# Patient Record
Sex: Female | Born: 1955 | Race: White | Hispanic: No | Smoking: Former smoker
Health system: Southern US, Community
[De-identification: ages and names within clinical notes are randomized; demographics above are authoritative.]

## PROBLEM LIST (undated history)

## (undated) DIAGNOSIS — F329 Major depressive disorder, single episode, unspecified: Secondary | ICD-10-CM

## (undated) DIAGNOSIS — J449 Chronic obstructive pulmonary disease, unspecified: Secondary | ICD-10-CM

## (undated) DIAGNOSIS — Z87442 Personal history of urinary calculi: Secondary | ICD-10-CM

## (undated) DIAGNOSIS — F411 Generalized anxiety disorder: Secondary | ICD-10-CM

## (undated) DIAGNOSIS — R030 Elevated blood-pressure reading, without diagnosis of hypertension: Secondary | ICD-10-CM

## (undated) DIAGNOSIS — K219 Gastro-esophageal reflux disease without esophagitis: Secondary | ICD-10-CM

## (undated) DIAGNOSIS — R55 Syncope and collapse: Secondary | ICD-10-CM

## (undated) DIAGNOSIS — E041 Nontoxic single thyroid nodule: Secondary | ICD-10-CM

## (undated) HISTORY — DX: Generalized anxiety disorder: F41.1

## (undated) HISTORY — DX: Gastro-esophageal reflux disease without esophagitis: K21.9

## (undated) HISTORY — DX: Major depressive disorder, single episode, unspecified: F32.9

## (undated) HISTORY — DX: Chronic obstructive pulmonary disease, unspecified: J44.9

## (undated) HISTORY — DX: Syncope and collapse: R55

## (undated) HISTORY — DX: Nontoxic single thyroid nodule: E04.1

## (undated) HISTORY — DX: Elevated blood-pressure reading, without diagnosis of hypertension: R03.0

## (undated) HISTORY — DX: Personal history of urinary calculi: Z87.442

---

## 1999-02-03 ENCOUNTER — Emergency Department (HOSPITAL_COMMUNITY): Admission: EM | Admit: 1999-02-03 | Discharge: 1999-02-03 | Payer: Self-pay | Admitting: Emergency Medicine

## 1999-02-03 ENCOUNTER — Encounter: Payer: Self-pay | Admitting: Emergency Medicine

## 1999-02-11 ENCOUNTER — Other Ambulatory Visit: Admission: RE | Admit: 1999-02-11 | Discharge: 1999-02-11 | Payer: Self-pay | Admitting: Obstetrics and Gynecology

## 2000-03-24 ENCOUNTER — Other Ambulatory Visit: Admission: RE | Admit: 2000-03-24 | Discharge: 2000-03-24 | Payer: Self-pay | Admitting: Obstetrics and Gynecology

## 2001-09-04 ENCOUNTER — Inpatient Hospital Stay (HOSPITAL_COMMUNITY): Admission: EM | Admit: 2001-09-04 | Discharge: 2001-09-09 | Payer: Self-pay

## 2001-09-04 ENCOUNTER — Encounter: Payer: Self-pay | Admitting: Oral Surgery

## 2001-10-06 ENCOUNTER — Other Ambulatory Visit: Admission: RE | Admit: 2001-10-06 | Discharge: 2001-10-06 | Payer: Self-pay | Admitting: Obstetrics and Gynecology

## 2003-05-02 ENCOUNTER — Other Ambulatory Visit: Admission: RE | Admit: 2003-05-02 | Discharge: 2003-05-02 | Payer: Self-pay | Admitting: Obstetrics and Gynecology

## 2005-01-13 ENCOUNTER — Other Ambulatory Visit: Admission: RE | Admit: 2005-01-13 | Discharge: 2005-01-13 | Payer: Self-pay | Admitting: Obstetrics and Gynecology

## 2005-05-23 ENCOUNTER — Emergency Department (HOSPITAL_COMMUNITY): Admission: EM | Admit: 2005-05-23 | Discharge: 2005-05-23 | Payer: Self-pay | Admitting: Emergency Medicine

## 2008-10-28 ENCOUNTER — Encounter: Payer: Self-pay | Admitting: Internal Medicine

## 2008-11-14 ENCOUNTER — Ambulatory Visit: Payer: Self-pay | Admitting: Internal Medicine

## 2008-11-14 DIAGNOSIS — R030 Elevated blood-pressure reading, without diagnosis of hypertension: Secondary | ICD-10-CM

## 2008-11-14 HISTORY — DX: Elevated blood-pressure reading, without diagnosis of hypertension: R03.0

## 2008-11-15 DIAGNOSIS — K219 Gastro-esophageal reflux disease without esophagitis: Secondary | ICD-10-CM | POA: Insufficient documentation

## 2008-11-15 DIAGNOSIS — F411 Generalized anxiety disorder: Secondary | ICD-10-CM | POA: Insufficient documentation

## 2008-11-15 DIAGNOSIS — J4489 Other specified chronic obstructive pulmonary disease: Secondary | ICD-10-CM

## 2008-11-15 DIAGNOSIS — J449 Chronic obstructive pulmonary disease, unspecified: Secondary | ICD-10-CM | POA: Insufficient documentation

## 2008-11-15 DIAGNOSIS — Z87442 Personal history of urinary calculi: Secondary | ICD-10-CM | POA: Insufficient documentation

## 2008-11-15 HISTORY — DX: Personal history of urinary calculi: Z87.442

## 2008-11-15 HISTORY — DX: Generalized anxiety disorder: F41.1

## 2008-11-15 HISTORY — DX: Chronic obstructive pulmonary disease, unspecified: J44.9

## 2008-11-15 HISTORY — DX: Gastro-esophageal reflux disease without esophagitis: K21.9

## 2008-11-15 HISTORY — DX: Other specified chronic obstructive pulmonary disease: J44.89

## 2008-12-12 ENCOUNTER — Ambulatory Visit: Payer: Self-pay | Admitting: Internal Medicine

## 2008-12-12 DIAGNOSIS — F3289 Other specified depressive episodes: Secondary | ICD-10-CM | POA: Insufficient documentation

## 2008-12-12 DIAGNOSIS — F329 Major depressive disorder, single episode, unspecified: Secondary | ICD-10-CM

## 2008-12-12 HISTORY — DX: Other specified depressive episodes: F32.89

## 2008-12-12 HISTORY — DX: Major depressive disorder, single episode, unspecified: F32.9

## 2009-01-16 ENCOUNTER — Telehealth: Payer: Self-pay | Admitting: Internal Medicine

## 2009-03-14 ENCOUNTER — Telehealth: Payer: Self-pay | Admitting: Internal Medicine

## 2009-04-29 ENCOUNTER — Telehealth: Payer: Self-pay | Admitting: Internal Medicine

## 2009-10-05 ENCOUNTER — Inpatient Hospital Stay (HOSPITAL_COMMUNITY): Admission: EM | Admit: 2009-10-05 | Discharge: 2009-10-06 | Payer: Self-pay | Admitting: Emergency Medicine

## 2009-10-06 ENCOUNTER — Encounter (INDEPENDENT_AMBULATORY_CARE_PROVIDER_SITE_OTHER): Payer: Self-pay | Admitting: Internal Medicine

## 2009-10-06 ENCOUNTER — Ambulatory Visit: Payer: Self-pay | Admitting: Vascular Surgery

## 2009-10-07 ENCOUNTER — Encounter: Payer: Self-pay | Admitting: Internal Medicine

## 2009-10-07 ENCOUNTER — Encounter (INDEPENDENT_AMBULATORY_CARE_PROVIDER_SITE_OTHER): Payer: Self-pay | Admitting: Internal Medicine

## 2009-10-10 ENCOUNTER — Ambulatory Visit: Payer: Self-pay | Admitting: Internal Medicine

## 2009-10-10 DIAGNOSIS — R55 Syncope and collapse: Secondary | ICD-10-CM

## 2009-10-10 HISTORY — DX: Syncope and collapse: R55

## 2009-12-31 ENCOUNTER — Telehealth: Payer: Self-pay | Admitting: Internal Medicine

## 2010-01-13 ENCOUNTER — Telehealth: Payer: Self-pay | Admitting: Internal Medicine

## 2010-11-06 ENCOUNTER — Telehealth: Payer: Self-pay | Admitting: Internal Medicine

## 2010-12-27 HISTORY — PX: LAPAROSCOPIC GASTRIC SLEEVE RESECTION: SHX5895

## 2011-01-28 NOTE — Progress Notes (Signed)
Summary: REQ FOR RX  Phone Note Call from Patient Call back at [facility] Phone [phone]   Summary of Call: Patient is requesting a call back. She c/o URI symptoms and does not have insurance.  Initial call taken by: Lamar Sprinkles, CMA,  November 06, 2010 10:26 AM  Follow-up for Phone Call        attempted to call pt, phone rang then cut off.  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 Patient is requesting rx or samples of antibiotic.   C/o sinus congestion, productive cough w/green-gray mucus. She is using otc tylenol & mucinex. Please advise.  Additional Follow-up by: Lamar Sprinkles, CMA,  November 06, 2010 1:48 PM    Additional Follow-up for Phone Call Additional follow up Details #2::    Pt informed  Follow-up by: Lamar Sprinkles, CMA,  November 06, 2010 3:21 PM  New/Updated Medications: ZITHROMAX TRI-PAK 500 MG TAB (AZITHROMYCIN) Take one by mouth once daily for 3 days Prescriptions: ZITHROMAX TRI-PAK 500 MG TAB (AZITHROMYCIN) Take one by mouth once daily for 3 days  #3 x 0   Entered and Authorized by:   Etta Grandchild MD   Signed by:   Etta Grandchild MD on 11/06/2010   Method used:   Electronically to        [facility]  [phone]* (retail)       [address] [place], [place]  [address]       Ph: [phone] or [phone]       Fax: [phone]   RxID:   [id number]

## 2011-01-28 NOTE — Progress Notes (Signed)
Summary: 90 day request  Phone Note Refill Request Message from:  Fax from Pharmacy on January 13, 2010 11:02 AM  Refills Requested: Medication #1:  CYMBALTA 30 MG CPEP once daily   Dosage confirmed as above?Dosage Confirmed   Supply Requested: 3 months Pt request 90 day supply per pharmacy   Method Requested: Electronic Initial call taken by: Rock Nephew CMA,  January 13, 2010 11:02 AM    Prescriptions: CYMBALTA 30 MG CPEP (DULOXETINE HCL) once daily  #90 x 3   Entered by:   Rock Nephew CMA   Authorized by:   Etta Grandchild MD   Signed by:   Rock Nephew CMA on 01/13/2010   Method used:   Electronically to        [facility]* (retail)       [address]       [address] Shipping/mailing       [place], [place]  [address]       Ph: [phone]       Fax: [phone]   RxID:   [id number]

## 2011-01-28 NOTE — Progress Notes (Signed)
Summary: Tessalon pearls  Phone Note Call from Patient Call back at [facility] Phone [phone]   Summary of Call: Pt called c/o head cold with drainage. OTC meds are not helping and she would like Tessalon Pearls called to [facility] pharmacy. Please advise. Initial call taken by: Lucious Groves,  December 31, 2009 9:55 AM  Follow-up for Phone Call        done Follow-up by: Etta Grandchild MD,  December 31, 2009 9:58 AM  Additional Follow-up for Phone Call Additional follow up Details #1::        Pt notified. Additional Follow-up by: Lucious Groves CMA,  December 31, 2009 10:37 AM    New/Updated Medications: TESSALON PERLES 100 MG CAPS (BENZONATATE) One by mouth three times a day as needed for cough Prescriptions: TESSALON PERLES 100 MG CAPS (BENZONATATE) One by mouth three times a day as needed for cough  #35 x 1   Entered and Authorized by:   Etta Grandchild MD   Signed by:   Etta Grandchild MD on 12/31/2009   Method used:   Electronically to        [facility]* (retail)       [address]       [address] Shipping[address]mail[address]       [place], [place]  [address]       Ph: [phone]       Fax: [phone]   RxID:   [id number]

## 2011-04-01 LAB — POCT CARDIAC MARKERS
CKMB, poc: 1.4 ng/mL (ref 1.0–8.0)
Myoglobin, poc: 64.6 ng/mL (ref 12–200)
Myoglobin, poc: 76.5 ng/mL (ref 12–200)
Troponin i, poc: <0.05 ng/mL (ref 0.00–0.09)
Troponin i, poc: <0.05 ng/mL (ref 0.00–0.09)

## 2011-04-01 LAB — T4, FREE: Free T4: 1.06 ng/dL (ref 0.80–1.80)

## 2011-04-01 LAB — TROPONIN I
Troponin I: 0.01 ng/mL (ref 0.00–0.06)
Troponin I: 0.01 ng/mL (ref 0.00–0.06)

## 2011-04-01 LAB — URINALYSIS, ROUTINE W REFLEX MICROSCOPIC
Glucose, UA: NEGATIVE mg/dL
Nitrite: NEGATIVE

## 2011-04-01 LAB — CBC
HCT: 38.1 % (ref 36.0–46.0)
Hemoglobin: 13 g/dL (ref 12.0–15.0)
MCV: 86.9 fL (ref 78.0–100.0)
Platelets: 239 10*3/uL (ref 150–400)
WBC: 6.8 10*3/uL (ref 4.0–10.5)

## 2011-04-01 LAB — CK TOTAL AND CKMB (NOT AT ARMC)
CK, MB: 1.5 ng/mL (ref 0.3–4.0)
CK, MB: 1.6 ng/mL (ref 0.3–4.0)
CK, MB: 1.8 ng/mL (ref 0.3–4.0)
Relative Index: INVALID (ref 0.0–2.5)
Relative Index: INVALID (ref 0.0–2.5)
Total CK: 97 U/L (ref 7–177)
Total CK: 98 U/L (ref 7–177)

## 2011-04-01 LAB — POCT I-STAT, CHEM 8
BUN: 18 mg/dL (ref 6–23)
Calcium, Ion: 0.95 mmol/L — ABNORMAL LOW (ref 1.12–1.32)
Chloride: 109 mEq/L (ref 96–112)
Creatinine, Ser: 0.5 mg/dL (ref 0.4–1.2)
Glucose, Bld: 76 mg/dL (ref 70–99)
Hemoglobin: 15 g/dL (ref 12.0–15.0)
Potassium: 4.1 mEq/L (ref 3.5–5.1)

## 2011-04-01 LAB — COMPREHENSIVE METABOLIC PANEL
AST: 25 U/L (ref 0–37)
CO2: 27 mEq/L (ref 19–32)
Creatinine, Ser: 0.61 mg/dL (ref 0.4–1.2)
GFR calc non Af Amer: >60 mL/min (ref >60)

## 2011-04-01 LAB — MAGNESIUM: Magnesium: 1.8 mg/dL (ref 1.5–2.5)

## 2011-04-01 LAB — RAPID URINE DRUG SCREEN, HOSP PERFORMED
Benzodiazepines: POSITIVE — AB
Cocaine: NOT DETECTED
Opiates: NOT DETECTED
Tetrahydrocannabinol: POSITIVE — AB

## 2011-04-01 LAB — DIFFERENTIAL
Basophils Absolute: 0 10*3/uL (ref 0.0–0.1)
Basophils Relative: 0 % (ref 0–1)
Lymphocytes Relative: 33 % (ref 12–46)
Lymphs Abs: 2.6 10*3/uL (ref 0.7–4.0)
Neutro Abs: 4.2 10*3/uL (ref 1.7–7.7)
Neutrophils Relative %: 54 % (ref 43–77)

## 2011-05-14 NOTE — Discharge Summary (Signed)
. [facility]  Patient:    Meredith Hayes, Meredith Hayes Visit Number: [id number] MRN: [id number]          Service Type: MED Location: [phone] Attending Physician:  Duke Salvia Dictated by:   Cornell Barman, P.A. Admit Date:  09/04/2001 Disc. Date: 09/08/01   CC:         Corwin Levins, M.D. [facility]   Discharge Summary  DISCHARGE DIAGNOSES: 1. Hypoxemia. 2. Dyspnea. 3. Asthmatic bronchitis.  HISTORY OF PRESENT ILLNESS:  Meredith Hayes is a 55 year old white female who presents with a 3 to 4 day history of cough which was nonproductive.  She had associated low-grade fever with progressive dyspnea.  The patient presented to the emergency department where she was found to be hypoxemic.  After several nebulizer treatments the patient was still short of breath.  The patient was admitted for IV Solu-Medrol, hand-held nebulizer therapy, and oxygen.  PAST MEDICAL HISTORY: 1. History of scarlet fever. 2. Status post dilatation and curettage. 3. Tobacco use for about 30 years.  HOSPITAL COURSE:  PULMONARY:  Hypoxemia secondary to acute asthmatic bronchitis in a patient with an extensive history of tobacco use.  The patient was admitted and started on IV steroids, oxygen, hand-held nebulizer.  She was also treated with Zithromax.  The patients condition was slow to improve. She was very slowly tapered off of her Solu-Medrol to oral steroids.  The patient has been tapered now to room air, and is maintaining saturations around 96% even after exertion.  The patient has been discouraged with her slow progress.  We did talk to the patient about smoking cessation, and the patient was enthusiastically willing at this time to discontinue cigarettes. She has been started on Wellbutrin, and is also using a nicotine patch. Again, as noted, she is quite motivated to quit smoking.  It was felt that possibly she had some underlying allergies contributing to her  persistent asthmatic bronchitic flare.  Therefore, she was started on some Allegra. Overall, the patients condition has improved, and she is felt to be stable for discharge.  DISCHARGE LABORATORY DATA:  Hemoglobin 15.1, hematocrit 43.5.  Chest x-ray showed no active disease.  DISCHARGE MEDICATIONS: 1. Prednisone taper 20 mg two tabs x 3 days, then 1-1/2 tabs x 3 days, then 1    tab x 3 days, then 1/2 tab x 3 days. 2. Advair metered dose inhaler 250/50 one puff b.i.d. 3. Allegra 180 mg q.d. 4. Wellbutrin 150 mg b.i.d. 5. Robitussin AC 20 mL q.8h. x 4 to 5 days, then p.r.n. 6. Xanax 0.5 mg q.4-6h. p.r.n. 7. ______ as at home. 8. Ambien p.r.n.  FOLLOWUP:  Dr. Jonny Ruiz in 2 to 3 weeks. Dictated by:   Cornell Barman, P.A. Attending Physician:  Duke Salvia DD:  09/08/01 TD:  09/08/01 Job: [id number]

## 2011-05-14 NOTE — H&P (Signed)
[place]  Patient:    Meredith Hayes, Meredith Hayes Visit Number: [id number] MRN: [id number]          Service Type: EMS Location: [place] Attending Physician:  Pearletha Alfred Dictated by:   Rosalyn Gess. Norins, M.D. [facility] Admit Date:  09/04/2001                           History and Physical  CHIEF COMPLAINT:  Shortness of breath.  PRIMARY PHYSICIAN:  Corwin Levins, M.D.  GYNECOLOGIST:  Trevor Iha, M.D.  HISTORY OF PRESENT ILLNESS:  Mrs. Foskey is a 55 year old married white female with no prior history of pulmonary disease who presents with a three to four-day history of cough which was nonproductive, low grade fever and progressive shortness of breath. She got to the point where she was unable to speak clearly or whole sentences. She did have some wheezing. She felt she was not moving any air.  On presentation to the emergency department, she was found to have hypoxemia by O2 saturation.  After several nebulizer treatments she continued to have persistent shortness of breath. She was now admitted for IV steroids and hand-held nebulizer therapy and O2 support.  PAST MEDICAL HISTORY: Usual childhood disease, scarlet fever with _________ penicillin treatment.  Menarche at age 55, regular menses. She is now four years postmenopausal. She is gravida 0, para 0.  PAST SURGICAL HISTORY:  Dilatation and curettage.  CURRENT MEDICATION:  Hormone replacement therapy only.  HABITS:  Tobacco one pack per day for greater than 30-pack-years.  ETOH three to four ounces per week.  Rare social use of THC.  ALLERGIES:  PENICILLIN which causes angioedema.  FAMILY HISTORY:  Negative for any pulmonary disease.  She had a grandfather, father and nephew with ASDs but no coronary artery disease.  Negative for breast cancer, negative for ovarian cancer.  Negative for colon cancer.  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  She  has been married for 16 years.  She works as a Agricultural engineer.  She has a good relationship with her husband who is present in the ER.  HEALTH MAINTENANCE:  The patients last mammogram was one year ago.  Last Pap smear was one year ago and she is scheduled to see her gynecologist in the near future.  REVIEW OF SYSTEMS:  Negative except for present illness.  PHYSICAL EXAMINATION:  GENERAL APPEARANCE:  This is an obese white female in no acute distress with no increased work of breathing.  VITAL SIGNS:  Temperature 97.5, blood pressure 122/76, respiratory rate 20, heart rate 115, O2 sat on two liters was 93%.  HEENT:  Normocephalic and atraumatic.  TMs were normal.  Oropharynx without lesions. Conjunctiva and sclera were clear. PERRLA.  EOMI. Funduscopic exam revealed normal disc margins but no vascular abnormalities.  NECK:  Supple. There was no thyromegaly or nodes.  No adenopathy was noted in the cervical or supraclavicular regions.  CHEST:  Decreased breath sounds were noted. She had diffuse inspiratory and expiratory wheezing with prolonged expiratory phase.  She had no increased work of breathing. She was not using accessory muscles for respiration.  CARDIOVASCULAR:  There were 2+ radial pulse.  She had a regular tachycardia. She had no murmurs, rubs, or gallops.  The patient had no JVD.  She had no carotid bruits.  BREASTS:  Exam deferred to gynecology.  ABDOMEN:  Obese with  a large panniculus. Palpation was hindered by her size. She was nontender with no masses.  PELVIC AND RECTAL:  Exams deferred to gynecology.  EXTREMITIES:  The patient has a great toenail job but no toenail deformities, no toe deformities.  She had no clubbing or cyanosis.  Upper extremities were normal.  NEUROLOGICAL:  Grossly nonfocal.  DATABASE:  Hemoglobin 15.1, hematocrit 43.5, white count 7900 with 66% segs, 17% lymphs, 13% monos, 4% eosinophils; platelet count 217,000.  Chemistry showed  sodium 140, potassium 3.9, chloride 105, BUN 14, creatinine 0.6, glucose 112.  Chest x-ray is not available at this time.  ASSESSMENT AND PLAN: 1. Pulmonary:  A patient with asthmatic bronchitis in a smoker.  There is    no evidence of bacterial infection; however, I cannot rule out viral or    atypical infection.  PLAN:  The patient is admitted to a regular bed.  She    will be started on IV Solu-Medrol at 125 mg first dose and 80 mg q.12h.    and will taper as quickly as possible. The patient will be started on    azithromycin at 500 mg first dose and 250 mg daily.  Will continue O2    support. The patient will be provided with Xanax to take on a p.r.n. basis    for anxiety. 2. Tobacco abuse:  Discussed this with the patient and she is interested    at this point in smoking cessation therapy.  I will not use nicotine patch    at this time.  Will use Xanax as noted above. The patient will need    referral to a smoking cessation program at the time of discharge. Dictated by:   Rosalyn Gess. Norins, M.D. [facility] Attending Physician:  Pearletha Alfred DD:  09/04/01 TD:  09/04/01 Job: [id number]

## 2011-05-27 ENCOUNTER — Encounter: Payer: Self-pay | Admitting: Internal Medicine

## 2011-05-27 ENCOUNTER — Ambulatory Visit (INDEPENDENT_AMBULATORY_CARE_PROVIDER_SITE_OTHER): Payer: PRIVATE HEALTH INSURANCE | Admitting: Internal Medicine

## 2011-05-27 DIAGNOSIS — L02818 Cutaneous abscess of other sites: Secondary | ICD-10-CM | POA: Insufficient documentation

## 2011-05-27 DIAGNOSIS — F329 Major depressive disorder, single episode, unspecified: Secondary | ICD-10-CM

## 2011-05-27 DIAGNOSIS — F411 Generalized anxiety disorder: Secondary | ICD-10-CM

## 2011-05-27 DIAGNOSIS — F3289 Other specified depressive episodes: Secondary | ICD-10-CM

## 2011-05-27 DIAGNOSIS — Z0000 Encounter for general adult medical examination without abnormal findings: Secondary | ICD-10-CM | POA: Insufficient documentation

## 2011-05-27 DIAGNOSIS — L03818 Cellulitis of other sites: Secondary | ICD-10-CM | POA: Insufficient documentation

## 2011-05-27 MED ORDER — DOXYCYCLINE HYCLATE 100 MG PO TABS: 100.0000 mg | ORAL_TABLET | Freq: Two times a day (BID) | ORAL | Status: AC

## 2011-05-27 MED ORDER — ALPRAZOLAM 0.5 MG PO TABS: 0.5000 mg | ORAL_TABLET | Freq: Every day | ORAL | Status: DC | PRN

## 2011-05-27 NOTE — Progress Notes (Signed)
  Subjective:    Patient ID: Meredith Hayes, female    DOB: 1956/05/03, 55 y.o.   MRN: [id number]  HPI Here for acute;  C/o RUQ area tender, red, swelling area with small bumps for 2 wks, without red streaks, fever, chills or drainage.  Denies worsening depressive symptoms, suicidal ideation, or panic, though has ongoing anxiety, not increased recently.  Unfortunately gained wt on cymbalta and blames the med, caused apathy so stopped and does not want to try SSRI or similar med. Main trouble seems to be getting sleep at night when her brain wont switch off, and the xanax has been helping.  Pt denies chest pain, increased sob or doe, wheezing, orthopnea, PND, increased LE swelling, palpitations, dizziness or syncope.  Pt denies new neurological symptoms such as new headache, or facial or extremity weakness or numbness   Pt denies polydipsia, polyuria.  Pt states overall good compliance with meds, little exercise however.    Past Medical History  Diagnosis Date  . ANXIETY 11/15/2008  . COPD 11/15/2008  . DEPRESSIVE DISORDER 12/12/2008  . ELEVATED BLOOD PRESSURE WITHOUT DIAGNOSIS OF HYPERTENSION 11/14/2008  . GERD 11/15/2008  . 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 SYNCOPE 10/10/2009   No past surgical history on file.  reports that she has quit smoking. She does not have any smokeless tobacco history on file. She reports that she does not drink alcohol or use illicit drugs. family history includes Arthritis in her other and Hypertension in her other. Allergies  Allergen Reactions  . Penicillins    No current outpatient prescriptions on file prior to visit.   Review of Systems All otherwise neg per pt     Objective:   Physical Exam BP 100/70  Pulse 90  Temp(Src) 97.9 F (36.6 C) (Oral)  Ht 5\' 5"  (1.651 m)  Wt 264 lb (119.75 kg)  BMI 43.93 kg/m2  SpO2 97% Physical Exam  VS noted Constitutional: Pt appears well-developed and well-nourished.  HENT: Head: Normocephalic.  Right Ear:  External ear normal.  Left Ear: External ear normal.  Eyes: Conjunctivae and EOM are normal. Pupils are equal, round, and reactive to light.  Neck: Normal range of motion. Neck supple.  Cardiovascular: Normal rate and regular rhythm.   Pulmonary/Chest: Effort normal and breath sounds normal.  Abd:  Soft, NT, non-distended, + BS Neurological: Pt is alert. No cranial nerve deficit.  Skin: Skin is warm. No erythema except for RUQ abd 2 cm area red, tender, mild swelling, no fluctuance/drainage/red streaks  Psychiatric: Pt behavior is normal. Thought content normal.  1+ nervous, not depressed affect       Assessment & Plan:

## 2011-05-27 NOTE — Assessment & Plan Note (Signed)
Declines SSRI and has stopped her cymbalta due to wt gain and apathy; to f/u further with PCP as needed

## 2011-05-27 NOTE — Assessment & Plan Note (Signed)
stable overall by hx and exam, most recent data reviewed with pt, and pt to continue medical treatment as before - gave refill xanax per pt request , does not appear to abuse or divert

## 2011-05-27 NOTE — Assessment & Plan Note (Signed)
Mild ruq area, cant r/o shingles but hx not consistent, and high risk MRSA due to emplyment;  Will tx with doxy course,  to f/u any worsening symptoms or concerns

## 2011-05-27 NOTE — Patient Instructions (Signed)
Take all new medications as prescribed Continue all other medications as before  

## 2011-08-03 ENCOUNTER — Other Ambulatory Visit: Payer: Self-pay

## 2011-08-03 DIAGNOSIS — F411 Generalized anxiety disorder: Secondary | ICD-10-CM

## 2011-08-03 NOTE — Telephone Encounter (Signed)
A user error has taken place: encounter opened in error, closed for administrative reasons.

## 2011-08-03 NOTE — Telephone Encounter (Signed)
To dr Yetta Barre - PCP

## 2011-08-06 ENCOUNTER — Telehealth: Payer: Self-pay

## 2011-08-06 DIAGNOSIS — F411 Generalized anxiety disorder: Secondary | ICD-10-CM

## 2011-08-06 MED ORDER — ALPRAZOLAM 0.5 MG PO TABS: 0.5000 mg | ORAL_TABLET | Freq: Every day | ORAL | Status: DC | PRN

## 2011-08-06 NOTE — Telephone Encounter (Signed)
Please advise if ok to refill xanax. Thanks

## 2011-08-06 NOTE — Telephone Encounter (Signed)
Patient is requesting a refill on Alprazolam 

## 2011-08-06 NOTE — Telephone Encounter (Signed)
RX called in and patient notified

## 2011-08-06 NOTE — Telephone Encounter (Signed)
yes

## 2011-08-06 NOTE — Telephone Encounter (Signed)
Pt is requesting a return call regarding Xanax refill.

## 2011-10-01 ENCOUNTER — Other Ambulatory Visit (INDEPENDENT_AMBULATORY_CARE_PROVIDER_SITE_OTHER): Payer: PRIVATE HEALTH INSURANCE

## 2011-10-01 ENCOUNTER — Ambulatory Visit (INDEPENDENT_AMBULATORY_CARE_PROVIDER_SITE_OTHER): Payer: PRIVATE HEALTH INSURANCE | Admitting: Internal Medicine

## 2011-10-01 ENCOUNTER — Encounter: Payer: Self-pay | Admitting: Internal Medicine

## 2011-10-01 ENCOUNTER — Other Ambulatory Visit: Payer: Self-pay | Admitting: Internal Medicine

## 2011-10-01 VITALS — BP 120/76 | HR 84 | Temp 98.7°F | Resp 16 | Wt 267.0 lb

## 2011-10-01 DIAGNOSIS — J449 Chronic obstructive pulmonary disease, unspecified: Secondary | ICD-10-CM

## 2011-10-01 DIAGNOSIS — D229 Melanocytic nevi, unspecified: Secondary | ICD-10-CM

## 2011-10-01 DIAGNOSIS — Z0000 Encounter for general adult medical examination without abnormal findings: Secondary | ICD-10-CM

## 2011-10-01 DIAGNOSIS — Z1231 Encounter for screening mammogram for malignant neoplasm of breast: Secondary | ICD-10-CM | POA: Insufficient documentation

## 2011-10-01 DIAGNOSIS — Z8051 Family history of malignant neoplasm of kidney: Secondary | ICD-10-CM

## 2011-10-01 DIAGNOSIS — Z87442 Personal history of urinary calculi: Secondary | ICD-10-CM

## 2011-10-01 LAB — LIPID PANEL
Cholesterol: 207 mg/dL — ABNORMAL HIGH (ref 0–200)
HDL: 47.8 mg/dL (ref >39.00)
Total CHOL/HDL Ratio: 4
Triglycerides: 74 mg/dL (ref 0.0–149.0)
VLDL: 14.8 mg/dL (ref 0.0–40.0)

## 2011-10-01 LAB — LDL CHOLESTEROL, DIRECT: Direct LDL: 145.9 mg/dL

## 2011-10-01 LAB — COMPREHENSIVE METABOLIC PANEL
ALT: 26 U/L (ref 0–35)
AST: 22 U/L (ref 0–37)
CO2: 30 mEq/L (ref 19–32)
Calcium: 9 mg/dL (ref 8.4–10.5)
Chloride: 103 mEq/L (ref 96–112)
GFR: 102.49 mL/min (ref >60.00)
Sodium: 140 mEq/L (ref 135–145)
Total Bilirubin: 0.6 mg/dL (ref 0.3–1.2)
Total Protein: 7.8 g/dL (ref 6.0–8.3)

## 2011-10-01 LAB — CBC WITH DIFFERENTIAL/PLATELET
Basophils Absolute: 0.1 10*3/uL (ref 0.0–0.1)
Eosinophils Absolute: 0.2 10*3/uL (ref 0.0–0.7)
Lymphocytes Relative: 30.9 % (ref 12.0–46.0)
Monocytes Relative: 8.6 % (ref 3.0–12.0)
Platelets: 259 10*3/uL (ref 150.0–400.0)
RDW: 13.7 % (ref 11.5–14.6)

## 2011-10-01 LAB — URINALYSIS, ROUTINE W REFLEX MICROSCOPIC
Ketones, ur: NEGATIVE
Specific Gravity, Urine: 1.025 (ref 1.000–1.030)
Total Protein, Urine: NEGATIVE
Urine Glucose: NEGATIVE
Urobilinogen, UA: 0.2 (ref 0.0–1.0)

## 2011-10-01 NOTE — Assessment & Plan Note (Signed)
Exam done, labs ordered, GYN will do her PAP next week, she needs a screening colonoscopy, labs ordered, mammogram ordered

## 2011-10-01 NOTE — Assessment & Plan Note (Signed)
Derm referral.

## 2011-10-01 NOTE — Patient Instructions (Signed)

## 2011-10-01 NOTE — Assessment & Plan Note (Signed)
mammo ordered.

## 2011-10-01 NOTE — Progress Notes (Signed)
  Subjective:    Patient ID: Meredith Hayes, female    DOB: February 22, 1956, 55 y.o.   MRN: [id number]  HPI She returns for a physical and offers no complaints. She sees her GYN next week for a PAP.   Review of Systems  Constitutional: Positive for unexpected weight change (weight gain). Negative for fever, chills, diaphoresis, activity change, appetite change and fatigue.  HENT: Negative.   Eyes: Negative.   Respiratory: Negative.   Cardiovascular: Negative.   Gastrointestinal: Negative.   Genitourinary: Negative.   Musculoskeletal: Negative.   Skin: Negative.   Neurological: Negative.   Hematological: Negative.   Psychiatric/Behavioral: Negative.        Objective:   Physical Exam  Vitals reviewed. Constitutional: She is oriented to person, place, and time. She appears well-developed and well-nourished. No distress.  HENT:  Mouth/Throat: Oropharynx is clear and moist. No oropharyngeal exudate.  Eyes: Conjunctivae are normal. Right eye exhibits no discharge. Left eye exhibits no discharge. No scleral icterus.  Neck: Normal range of motion. Neck supple. No JVD present. No tracheal deviation present. No thyromegaly present.  Cardiovascular: Normal rate, regular rhythm, normal heart sounds and intact distal pulses.  Exam reveals no gallop and no friction rub.   No murmur heard. Pulmonary/Chest: Effort normal and breath sounds normal. No stridor. No respiratory distress. She has no wheezes. She has no rales. She exhibits no tenderness.  Abdominal: Soft. Bowel sounds are normal. She exhibits no distension. There is no tenderness. There is no rebound and no guarding.  Musculoskeletal: Normal range of motion. She exhibits no edema and no tenderness.  Lymphadenopathy:    She has no cervical adenopathy.  Neurological: She is oriented to person, place, and time. She displays normal reflexes. She exhibits normal muscle tone. Coordination normal.  Skin: Skin is warm and dry. No rash noted. She  is not diaphoretic. No erythema. No pallor.       She has three fleshy nevi on her right flank and she wants to have them removed  Psychiatric: She has a normal mood and affect. Her behavior is normal. Judgment and thought content normal.          Assessment & Plan:

## 2011-10-01 NOTE — Assessment & Plan Note (Signed)
Will check a UA and renal ultrasound

## 2011-10-03 ENCOUNTER — Encounter: Payer: Self-pay | Admitting: Internal Medicine

## 2011-10-03 NOTE — Assessment & Plan Note (Signed)
stable °

## 2011-10-05 ENCOUNTER — Ambulatory Visit
Admission: RE | Admit: 2011-10-05 | Discharge: 2011-10-05 | Disposition: A | Discharge status: Home / Self Care | Payer: PRIVATE HEALTH INSURANCE | Source: Ambulatory Visit | Attending: Internal Medicine | Admitting: Internal Medicine

## 2011-10-05 DIAGNOSIS — Z8051 Family history of malignant neoplasm of kidney: Secondary | ICD-10-CM

## 2011-10-06 ENCOUNTER — Encounter: Payer: Self-pay | Admitting: Internal Medicine

## 2011-10-15 ENCOUNTER — Telehealth: Payer: Self-pay | Admitting: Internal Medicine

## 2011-10-15 NOTE — Telephone Encounter (Signed)
Pt stated she is having an allergic reaction to flu shot. Pt was given flu shot at work. Pt stated she called PCP at [facility] and was told no available appts and to call [place] office. I spoke to patient and ask her did she talk to triage nurse she said she left a message. I told pt we do not see other patient from other location however I was in process of transferring the call to my site supervisor Cinda Quest. Mrs Vonita Moss office and spoke with Amy who was going to contact patient. Patient stated she will just go to urgent care.

## 2011-10-15 NOTE — Telephone Encounter (Signed)
Per Amy at [facility] will contact the patient to assist her.

## 2011-10-18 ENCOUNTER — Ambulatory Visit (INDEPENDENT_AMBULATORY_CARE_PROVIDER_SITE_OTHER): Payer: PRIVATE HEALTH INSURANCE | Admitting: Internal Medicine

## 2011-10-18 ENCOUNTER — Encounter: Payer: Self-pay | Admitting: Internal Medicine

## 2011-10-18 VITALS — BP 110/68 | HR 80 | Temp 97.1°F | Resp 16

## 2011-10-18 DIAGNOSIS — T7840XA Allergy, unspecified, initial encounter: Secondary | ICD-10-CM

## 2011-10-18 DIAGNOSIS — Z888 Allergy status to other drugs, medicaments and biological substances status: Secondary | ICD-10-CM

## 2011-10-18 NOTE — Assessment & Plan Note (Signed)
She had an allergic reaction to the flu vaccine but that has resolved, no treatment needed at this time

## 2011-10-18 NOTE — Progress Notes (Signed)
  Subjective:    Patient ID: Meredith Hayes, female    DOB: 1956-01-08, 55 y.o.   MRN: [id number]  Allergic Reaction This is a new problem. The current episode started 5 to 7 days ago. The problem occurs constantly. The problem has been gradually improving since onset. The problem is mild. Associated with: the flu vaccine in her right shoulder. The time of exposure was just prior to onset. Pertinent negatives include no abdominal pain, chest pain, chest pressure, coughing, diarrhea, difficulty breathing, drooling, eye itching, eye redness, eye watering, globus sensation, hyperventilation, itching, rash, stridor, trouble swallowing, vomiting or wheezing. (Redness and swelling over the right the shoulder, see the 2 page documentary with pictures that she produced today) Swelling location: localized to the inj sight. Past treatments include nothing. The treatment provided significant relief.      Review of Systems  Constitutional: Negative.   HENT: Negative.  Negative for drooling and trouble swallowing.   Eyes: Negative.  Negative for redness and itching.  Respiratory: Negative.  Negative for cough, wheezing and stridor.   Cardiovascular: Negative.  Negative for chest pain.  Gastrointestinal: Negative.  Negative for vomiting, abdominal pain and diarrhea.  Genitourinary: Negative.   Musculoskeletal: Negative.   Skin: Negative.  Negative for itching and rash.  Neurological: Negative.   Hematological: Negative.   Psychiatric/Behavioral: Negative.        Objective:   Physical Exam  Vitals reviewed. Constitutional: She appears well-developed and well-nourished. No distress.  HENT:  Head: Normocephalic and atraumatic.  Mouth/Throat: No oropharyngeal exudate.  Eyes: Conjunctivae are normal. Right eye exhibits no discharge. Left eye exhibits no discharge. No scleral icterus.  Neck: Normal range of motion. Neck supple. No JVD present. No tracheal deviation present. No thyromegaly present.    Cardiovascular: Normal rate, regular rhythm, normal heart sounds and intact distal pulses.  Exam reveals no gallop and no friction rub.   No murmur heard. Pulmonary/Chest: Effort normal and breath sounds normal. No stridor. No respiratory distress. She has no wheezes. She has no rales. She exhibits no tenderness.  Abdominal: Soft. Bowel sounds are normal. She exhibits no distension and no mass. There is no tenderness. There is no rebound and no guarding.  Musculoskeletal: Normal range of motion. She exhibits no edema and no tenderness.       Right shoulder: She exhibits normal range of motion, no tenderness, no bony tenderness, no swelling, no effusion, no crepitus, no deformity, no laceration, no pain, no spasm, normal pulse and normal strength.       The right shoulder is normal now  Lymphadenopathy:    She has no cervical adenopathy.  Skin: Skin is warm and dry. No rash noted. She is not diaphoretic. No erythema. No pallor.  Psychiatric: She has a normal mood and affect. Her behavior is normal. Judgment and thought content normal.          Assessment & Plan:

## 2011-11-10 LAB — HM DEXA SCAN: HM Dexa Scan: NORMAL

## 2011-11-26 ENCOUNTER — Encounter: Payer: Self-pay | Admitting: Internal Medicine

## 2011-12-02 ENCOUNTER — Encounter: Payer: Self-pay | Admitting: Internal Medicine

## 2011-12-02 ENCOUNTER — Ambulatory Visit (AMBULATORY_SURGERY_CENTER): Payer: PRIVATE HEALTH INSURANCE

## 2011-12-02 VITALS — Ht 65.0 in | Wt 260.0 lb

## 2011-12-02 DIAGNOSIS — Z1211 Encounter for screening for malignant neoplasm of colon: Secondary | ICD-10-CM

## 2011-12-02 MED ORDER — PEG-KCL-NACL-NASULF-NA ASC-C 100 G PO SOLR: 1.0000 | Freq: Once | ORAL | Status: DC

## 2011-12-09 ENCOUNTER — Encounter: Payer: Self-pay | Admitting: Internal Medicine

## 2011-12-09 ENCOUNTER — Ambulatory Visit (AMBULATORY_SURGERY_CENTER): Payer: PRIVATE HEALTH INSURANCE | Admitting: Internal Medicine

## 2011-12-09 VITALS — BP 117/74 | HR 83 | Temp 98.2°F | Resp 17 | Ht 65.0 in | Wt 260.0 lb

## 2011-12-09 DIAGNOSIS — D126 Benign neoplasm of colon, unspecified: Secondary | ICD-10-CM

## 2011-12-09 DIAGNOSIS — D129 Benign neoplasm of anus and anal canal: Secondary | ICD-10-CM

## 2011-12-09 DIAGNOSIS — Z1211 Encounter for screening for malignant neoplasm of colon: Secondary | ICD-10-CM

## 2011-12-09 DIAGNOSIS — D128 Benign neoplasm of rectum: Secondary | ICD-10-CM

## 2011-12-09 DIAGNOSIS — K5730 Diverticulosis of large intestine without perforation or abscess without bleeding: Secondary | ICD-10-CM

## 2011-12-09 HISTORY — PX: COLONOSCOPY: SHX174

## 2011-12-09 MED ORDER — ASPIRIN 81 MG PO TABS: 81.0000 mg | ORAL_TABLET | Freq: Every day | ORAL | Status: DC

## 2011-12-09 MED ORDER — SODIUM CHLORIDE 0.9 % IV SOLN: 500.0000 mL | INTRAVENOUS | Status: DC

## 2011-12-09 NOTE — Progress Notes (Signed)
Patient did not experience any of the following events: a burn prior to discharge; a fall within the facility; wrong site/side/patient/procedure/implant event; or a hospital transfer or hospital admission upon discharge from the facility. (G8907) Patient did not have preoperative order for IV antibiotic SSI prophylaxis. (G8918)  

## 2011-12-09 NOTE — Op Note (Signed)
[facility] [address] [place], [place]  [address]  COLONOSCOPY PROCEDURE REPORT  PATIENT:  Meredith Hayes, Meredith Hayes  MR#:  [id number] BIRTHDATE:  May 09, 1956, 54 yrs. old  GENDER:  female ENDOSCOPIST:  Iva Boop, MD, [facility] REF. BY:  Etta Grandchild, M.D. PROCEDURE DATE:  12/09/2011 PROCEDURE:  Colonoscopy with snare polypectomy ASA CLASS:  Class II INDICATIONS:  Routine Risk Screening MEDICATIONS:   These medications were titrated to patient response per physician's verbal order, Fentanyl 100 mcg IV, Versed 12 mg IV, Benadryl 12.5 mg IV  DESCRIPTION OF PROCEDURE:   After the risks benefits and alternatives of the procedure were thoroughly explained, informed consent was obtained.  Digital rectal exam was performed and revealed no abnormalities.   The LB [id number] endoscope was introduced through the anus and advanced to the cecum, which was identified by both the appendix and ileocecal valve, without limitations.  The quality of the prep was excellent, using MoviPrep.  The instrument was then slowly withdrawn as the colon was fully examined. <<PROCEDUREIMAGES>>  FINDINGS:  Two polyps were found in the rectum and sigmoid colon. 8-10 mm sigmoid polyp snared with cautery and 5 mm rectal polyp cold-snared.  Scattered diverticula were found throughout the colon.  This was otherwise a normal examination of the colon. Retroflexed views in the rectum revealed no abnormalities.    The time to cecum = 4:31  minutes. The scope was then withdrawn in 15:10 minutes from the cecum and the procedure completed. COMPLICATIONS:  None ENDOSCOPIC IMPRESSION: 1) Two polyps in the rectum and sigmoid colon - removed 2) Diverticula, scattered throughout the colon 3) Otherwise normal examination RECOMMENDATIONS: 1) Hold aspirin, aspirin products, and anti-inflammatory medication for 2 weeks. REPEAT EXAM:  In for Colonoscopy, pending biopsy results.  Iva Boop, MD, [facility]  CC:   Etta Grandchild, MD and The Patient  n. eSIGNED:   Iva Boop at 12/09/2011 10:01 AM  Lorenda Cahill, [phone]

## 2011-12-09 NOTE — Patient Instructions (Addendum)
Two polyps were removed - largest 8-10 mm. You have mild diverticulosis also. Please read the information provided, including the GERd handout. I recommend you discuss your reflux issues with Dr. Yetta Barre or you could schedule an appointment with me if you like. Iva Boop, MD, [facility]  Handouts on gerd, polyps, diverticulosis, high fiber diet  Discharge instructions given per blue and green sheets  We will mail you a letter in 1-2 weeks with the results of the pathology and Dr Marvell Fuller recommendations.  Please hold aspirin, aspirin products, and all anti inflammatory medicines for 2 weeks. Use tylenol only. May resume 12-23-11.

## 2011-12-10 ENCOUNTER — Telehealth: Payer: Self-pay | Admitting: *Deleted

## 2011-12-10 NOTE — Telephone Encounter (Signed)

## 2011-12-14 NOTE — Progress Notes (Signed)
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

## 2012-02-08 ENCOUNTER — Telehealth: Payer: Self-pay

## 2012-02-08 DIAGNOSIS — F411 Generalized anxiety disorder: Secondary | ICD-10-CM

## 2012-02-08 NOTE — Telephone Encounter (Signed)
Received faxed refill request for alprazolam 0.5mg  qd, last approved 08/06/11 #[id number]. Please advise if ok to refill, pt last seen 10/18/11 Thanks

## 2012-02-08 NOTE — Telephone Encounter (Signed)
ok 

## 2012-02-09 MED ORDER — ALPRAZOLAM 0.5 MG PO TABS: 0.5000 mg | ORAL_TABLET | Freq: Every day | ORAL | Status: DC | PRN

## 2012-08-14 ENCOUNTER — Telehealth: Payer: Self-pay

## 2012-08-14 NOTE — Telephone Encounter (Signed)
Received faxed refill request for alprazolam 0.5mg  qd, last approved 02/08/12 #[id number]. Please advise if ok to refill, pt last seen 10/18/11 Thanks

## 2012-08-14 NOTE — Telephone Encounter (Signed)
She needs to be seen.

## 2012-08-14 NOTE — Telephone Encounter (Signed)
Pharmacy notified per MD

## 2012-08-18 ENCOUNTER — Ambulatory Visit: Payer: PRIVATE HEALTH INSURANCE | Admitting: Internal Medicine

## 2012-10-02 ENCOUNTER — Encounter: Payer: PRIVATE HEALTH INSURANCE | Admitting: Internal Medicine

## 2012-10-02 ENCOUNTER — Other Ambulatory Visit: Payer: BC Managed Care – PPO

## 2012-10-02 ENCOUNTER — Other Ambulatory Visit (INDEPENDENT_AMBULATORY_CARE_PROVIDER_SITE_OTHER): Payer: BC Managed Care – PPO

## 2012-10-02 ENCOUNTER — Ambulatory Visit (INDEPENDENT_AMBULATORY_CARE_PROVIDER_SITE_OTHER): Payer: BC Managed Care – PPO | Admitting: Internal Medicine

## 2012-10-02 ENCOUNTER — Encounter: Payer: Self-pay | Admitting: Internal Medicine

## 2012-10-02 VITALS — BP 118/68 | HR 104 | Temp 97.1°F | Resp 16

## 2012-10-02 DIAGNOSIS — Z1231 Encounter for screening mammogram for malignant neoplasm of breast: Secondary | ICD-10-CM

## 2012-10-02 DIAGNOSIS — Z0000 Encounter for general adult medical examination without abnormal findings: Secondary | ICD-10-CM | POA: Insufficient documentation

## 2012-10-02 LAB — LIPID PANEL
HDL: 37.3 mg/dL — ABNORMAL LOW (ref >39.00)
LDL Cholesterol: 118 mg/dL — ABNORMAL HIGH (ref 0–99)
Total CHOL/HDL Ratio: 5
Triglycerides: 166 mg/dL — ABNORMAL HIGH (ref 0.0–149.0)
VLDL: 33.2 mg/dL (ref 0.0–40.0)

## 2012-10-02 LAB — CBC WITH DIFFERENTIAL/PLATELET
Basophils Absolute: 0 10*3/uL (ref 0.0–0.1)
Eosinophils Relative: 3.6 % (ref 0.0–5.0)
HCT: 43 % (ref 36.0–46.0)
Hemoglobin: 14.3 g/dL (ref 12.0–15.0)
Lymphs Abs: 2.5 10*3/uL (ref 0.7–4.0)
MCV: 86.6 fl (ref 78.0–100.0)
Monocytes Absolute: 0.8 10*3/uL (ref 0.1–1.0)
Monocytes Relative: 9.5 % (ref 3.0–12.0)
Neutro Abs: 4.9 10*3/uL (ref 1.4–7.7)
Platelets: 240 10*3/uL (ref 150.0–400.0)
RDW: 13.9 % (ref 11.5–14.6)

## 2012-10-02 LAB — COMPREHENSIVE METABOLIC PANEL
ALT: 24 U/L (ref 0–35)
Alkaline Phosphatase: 69 U/L (ref 39–117)
Creatinine, Ser: 0.6 mg/dL (ref 0.4–1.2)
Sodium: 139 mEq/L (ref 135–145)
Total Bilirubin: 0.4 mg/dL (ref 0.3–1.2)
Total Protein: 7.4 g/dL (ref 6.0–8.3)

## 2012-10-02 MED ORDER — INFLUENZA VIRUS VACCINE LIVE NA LIQD: 0.2000 mL | Freq: Once | NASAL | Status: DC

## 2012-10-02 NOTE — Patient Instructions (Signed)
Preventive Care for Adults, Female A healthy lifestyle and preventive care can promote health and wellness. Preventive health guidelines for women include the following key practices.  A routine yearly physical is a good way to check with your caregiver about your health and preventive screening. It is a chance to share any concerns and updates on your health, and to receive a thorough exam.  Visit your dentist for a routine exam and preventive care every 6 months. Brush your teeth twice a day and floss once a day. Good oral hygiene prevents tooth decay and gum disease.  The frequency of eye exams is based on your age, health, family medical history, use of contact lenses, and other factors. Follow your caregiver's recommendations for frequency of eye exams.  Eat a healthy diet. Foods like vegetables, fruits, whole grains, low-fat dairy products, and lean protein foods contain the nutrients you need without too many calories. Decrease your intake of foods high in solid fats, added sugars, and salt. Eat the right amount of calories for you.Get information about a proper diet from your caregiver, if necessary.  Regular physical exercise is one of the most important things you can do for your health. Most adults should get at least 150 minutes of moderate-intensity exercise (any activity that increases your heart rate and causes you to sweat) each week. In addition, most adults need muscle-strengthening exercises on 2 or more days a week.  Maintain a healthy weight. The body mass index (BMI) is a screening tool to identify possible weight problems. It provides an estimate of body fat based on height and weight. Your caregiver can help determine your BMI, and can help you achieve or maintain a healthy weight.For adults 20 years and older:  A BMI below 18.5 is considered underweight.  A BMI of 18.5 to 24.9 is normal.  A BMI of 25 to 29.9 is considered overweight.  A BMI of 30 and above is  considered obese.  Maintain normal blood lipids and cholesterol levels by exercising and minimizing your intake of saturated fat. Eat a balanced diet with plenty of fruit and vegetables. Blood tests for lipids and cholesterol should begin at age 20 and be repeated every 5 years. If your lipid or cholesterol levels are high, you are over 50, or you are at high risk for heart disease, you may need your cholesterol levels checked more frequently.Ongoing high lipid and cholesterol levels should be treated with medicines if diet and exercise are not effective.  If you smoke, find out from your caregiver how to quit. If you do not use tobacco, do not start.  If you are pregnant, do not drink alcohol. If you are breastfeeding, be very cautious about drinking alcohol. If you are not pregnant and choose to drink alcohol, do not exceed 1 drink per day. One drink is considered to be 12 ounces (355 mL) of beer, 5 ounces (148 mL) of wine, or 1.5 ounces (44 mL) of liquor.  Avoid use of street drugs. Do not share needles with anyone. Ask for help if you need support or instructions about stopping the use of drugs.  High blood pressure causes heart disease and increases the risk of stroke. Your blood pressure should be checked at least every 1 to 2 years. Ongoing high blood pressure should be treated with medicines if weight loss and exercise are not effective.  If you are 55 to 56 years old, ask your caregiver if you should take aspirin to prevent strokes.  Diabetes   screening involves taking a blood sample to check your fasting blood sugar level. This should be done once every 3 years, after age 45, if you are within normal weight and without risk factors for diabetes. Testing should be considered at a younger age or be carried out more frequently if you are overweight and have at least 1 risk factor for diabetes.  Breast cancer screening is essential preventive care for women. You should practice "breast  self-awareness." This means understanding the normal appearance and feel of your breasts and may include breast self-examination. Any changes detected, no matter how small, should be reported to a caregiver. Women in their 20s and 30s should have a clinical breast exam (CBE) by a caregiver as part of a regular health exam every 1 to 3 years. After age 40, women should have a CBE every year. Starting at age 40, women should consider having a mammography (breast X-ray test) every year. Women who have a family history of breast cancer should talk to their caregiver about genetic screening. Women at a high risk of breast cancer should talk to their caregivers about having magnetic resonance imaging (MRI) and a mammography every year.  The Pap test is a screening test for cervical cancer. A Pap test can show cell changes on the cervix that might become cervical cancer if left untreated. A Pap test is a procedure in which cells are obtained and examined from the lower end of the uterus (cervix).  Women should have a Pap test starting at age 21.  Between ages 21 and 29, Pap tests should be repeated every 2 years.  Beginning at age 30, you should have a Pap test every 3 years as long as the past 3 Pap tests have been normal.  Some women have medical problems that increase the chance of getting cervical cancer. Talk to your caregiver about these problems. It is especially important to talk to your caregiver if a new problem develops soon after your last Pap test. In these cases, your caregiver may recommend more frequent screening and Pap tests.  The above recommendations are the same for women who have or have not gotten the vaccine for human papillomavirus (HPV).  If you had a hysterectomy for a problem that was not cancer or a condition that could lead to cancer, then you no longer need Pap tests. Even if you no longer need a Pap test, a regular exam is a good idea to make sure no other problems are  starting.  If you are between ages 65 and 70, and you have had normal Pap tests going back 10 years, you no longer need Pap tests. Even if you no longer need a Pap test, a regular exam is a good idea to make sure no other problems are starting.  If you have had past treatment for cervical cancer or a condition that could lead to cancer, you need Pap tests and screening for cancer for at least 20 years after your treatment.  If Pap tests have been discontinued, risk factors (such as a new sexual partner) need to be reassessed to determine if screening should be resumed.  The HPV test is an additional test that may be used for cervical cancer screening. The HPV test looks for the virus that can cause the cell changes on the cervix. The cells collected during the Pap test can be tested for HPV. The HPV test could be used to screen women aged 30 years and older, and should   be used in women of any age who have unclear Pap test results. After the age of 30, women should have HPV testing at the same frequency as a Pap test.  Colorectal cancer can be detected and often prevented. Most routine colorectal cancer screening begins at the age of 50 and continues through age 75. However, your caregiver may recommend screening at an earlier age if you have risk factors for colon cancer. On a yearly basis, your caregiver may provide home test kits to check for hidden blood in the stool. Use of a small camera at the end of a tube, to directly examine the colon (sigmoidoscopy or colonoscopy), can detect the earliest forms of colorectal cancer. Talk to your caregiver about this at age 50, when routine screening begins. Direct examination of the colon should be repeated every 5 to 10 years through age 75, unless early forms of pre-cancerous polyps or small growths are found.  Hepatitis C blood testing is recommended for all people born from 1945 through 1965 and any individual with known risks for hepatitis C.  Practice  safe sex. Use condoms and avoid high-risk sexual practices to reduce the spread of sexually transmitted infections (STIs). STIs include gonorrhea, chlamydia, syphilis, trichomonas, herpes, HPV, and human immunodeficiency virus (HIV). Herpes, HIV, and HPV are viral illnesses that have no cure. They can result in disability, cancer, and death. Sexually active women aged 25 and younger should be checked for chlamydia. Older women with new or multiple partners should also be tested for chlamydia. Testing for other STIs is recommended if you are sexually active and at increased risk.  Osteoporosis is a disease in which the bones lose minerals and strength with aging. This can result in serious bone fractures. The risk of osteoporosis can be identified using a bone density scan. Women ages 65 and over and women at risk for fractures or osteoporosis should discuss screening with their caregivers. Ask your caregiver whether you should take a calcium supplement or vitamin D to reduce the rate of osteoporosis.  Menopause can be associated with physical symptoms and risks. Hormone replacement therapy is available to decrease symptoms and risks. You should talk to your caregiver about whether hormone replacement therapy is right for you.  Use sunscreen with sun protection factor (SPF) of 30 or more. Apply sunscreen liberally and repeatedly throughout the day. You should seek shade when your shadow is shorter than you. Protect yourself by wearing long sleeves, pants, a wide-brimmed hat, and sunglasses year round, whenever you are outdoors.  Once a month, do a whole body skin exam, using a mirror to look at the skin on your back. Notify your caregiver of new moles, moles that have irregular borders, moles that are larger than a pencil eraser, or moles that have changed in shape or color.  Stay current with required immunizations.  Influenza. You need a dose every fall (or winter). The composition of the flu vaccine  changes each year, so being vaccinated once is not enough.  Pneumococcal polysaccharide. You need 1 to 2 doses if you smoke cigarettes or if you have certain chronic medical conditions. You need 1 dose at age 65 (or older) if you have never been vaccinated.  Tetanus, diphtheria, pertussis (Tdap, Td). Get 1 dose of Tdap vaccine if you are younger than age 65, are over 65 and have contact with an infant, are a healthcare worker, are pregnant, or simply want to be protected from whooping cough. After that, you need a Td   booster dose every 10 years. Consult your caregiver if you have not had at least 3 tetanus and diphtheria-containing shots sometime in your life or have a deep or dirty wound.  HPV. You need this vaccine if you are a woman age 26 or younger. The vaccine is given in 3 doses over 6 months.  Measles, mumps, rubella (MMR). You need at least 1 dose of MMR if you were born in 1957 or later. You may also need a second dose.  Meningococcal. If you are age 19 to 21 and a first-year college student living in a residence hall, or have one of several medical conditions, you need to get vaccinated against meningococcal disease. You may also need additional booster doses.  Zoster (shingles). If you are age 60 or older, you should get this vaccine.  Varicella (chickenpox). If you have never had chickenpox or you were vaccinated but received only 1 dose, talk to your caregiver to find out if you need this vaccine.  Hepatitis A. You need this vaccine if you have a specific risk factor for hepatitis A virus infection or you simply wish to be protected from this disease. The vaccine is usually given as 2 doses, 6 to 18 months apart.  Hepatitis B. You need this vaccine if you have a specific risk factor for hepatitis B virus infection or you simply wish to be protected from this disease. The vaccine is given in 3 doses, usually over 6 months. Preventive Services / Frequency Ages 19 to 39  Blood  pressure check.** / Every 1 to 2 years.  Lipid and cholesterol check.** / Every 5 years beginning at age 20.  Clinical breast exam.** / Every 3 years for women in their 20s and 30s.  Pap test.** / Every 2 years from ages 21 through 29. Every 3 years starting at age 30 through age 65 or 70 with a history of 3 consecutive normal Pap tests.  HPV screening.** / Every 3 years from ages 30 through ages 65 to 70 with a history of 3 consecutive normal Pap tests.  Hepatitis C blood test.** / For any individual with known risks for hepatitis C.  Skin self-exam. / Monthly.  Influenza immunization.** / Every year.  Pneumococcal polysaccharide immunization.** / 1 to 2 doses if you smoke cigarettes or if you have certain chronic medical conditions.  Tetanus, diphtheria, pertussis (Tdap, Td) immunization. / A one-time dose of Tdap vaccine. After that, you need a Td booster dose every 10 years.  HPV immunization. / 3 doses over 6 months, if you are 26 and younger.  Measles, mumps, rubella (MMR) immunization. / You need at least 1 dose of MMR if you were born in 1957 or later. You may also need a second dose.  Meningococcal immunization. / 1 dose if you are age 19 to 21 and a first-year college student living in a residence hall, or have one of several medical conditions, you need to get vaccinated against meningococcal disease. You may also need additional booster doses.  Varicella immunization.** / Consult your caregiver.  Hepatitis A immunization.** / Consult your caregiver. 2 doses, 6 to 18 months apart.  Hepatitis B immunization.** / Consult your caregiver. 3 doses usually over 6 months. Ages 40 to 64  Blood pressure check.** / Every 1 to 2 years.  Lipid and cholesterol check.** / Every 5 years beginning at age 20.  Clinical breast exam.** / Every year after age 40.  Mammogram.** / Every year beginning at age 40   and continuing for as long as you are in good health. Consult with your  caregiver.  Pap test.** / Every 3 years starting at age 30 through age 65 or 70 with a history of 3 consecutive normal Pap tests.  HPV screening.** / Every 3 years from ages 30 through ages 65 to 70 with a history of 3 consecutive normal Pap tests.  Fecal occult blood test (FOBT) of stool. / Every year beginning at age 50 and continuing until age 75. You may not need to do this test if you get a colonoscopy every 10 years.  Flexible sigmoidoscopy or colonoscopy.** / Every 5 years for a flexible sigmoidoscopy or every 10 years for a colonoscopy beginning at age 50 and continuing until age 75.  Hepatitis C blood test.** / For all people born from 1945 through 1965 and any individual with known risks for hepatitis C.  Skin self-exam. / Monthly.  Influenza immunization.** / Every year.  Pneumococcal polysaccharide immunization.** / 1 to 2 doses if you smoke cigarettes or if you have certain chronic medical conditions.  Tetanus, diphtheria, pertussis (Tdap, Td) immunization.** / A one-time dose of Tdap vaccine. After that, you need a Td booster dose every 10 years.  Measles, mumps, rubella (MMR) immunization. / You need at least 1 dose of MMR if you were born in 1957 or later. You may also need a second dose.  Varicella immunization.** / Consult your caregiver.  Meningococcal immunization.** / Consult your caregiver.  Hepatitis A immunization.** / Consult your caregiver. 2 doses, 6 to 18 months apart.  Hepatitis B immunization.** / Consult your caregiver. 3 doses, usually over 6 months. Ages 65 and over  Blood pressure check.** / Every 1 to 2 years.  Lipid and cholesterol check.** / Every 5 years beginning at age 20.  Clinical breast exam.** / Every year after age 40.  Mammogram.** / Every year beginning at age 40 and continuing for as long as you are in good health. Consult with your caregiver.  Pap test.** / Every 3 years starting at age 30 through age 65 or 70 with a 3  consecutive normal Pap tests. Testing can be stopped between 65 and 70 with 3 consecutive normal Pap tests and no abnormal Pap or HPV tests in the past 10 years.  HPV screening.** / Every 3 years from ages 30 through ages 65 or 70 with a history of 3 consecutive normal Pap tests. Testing can be stopped between 65 and 70 with 3 consecutive normal Pap tests and no abnormal Pap or HPV tests in the past 10 years.  Fecal occult blood test (FOBT) of stool. / Every year beginning at age 50 and continuing until age 75. You may not need to do this test if you get a colonoscopy every 10 years.  Flexible sigmoidoscopy or colonoscopy.** / Every 5 years for a flexible sigmoidoscopy or every 10 years for a colonoscopy beginning at age 50 and continuing until age 75.  Hepatitis C blood test.** / For all people born from 1945 through 1965 and any individual with known risks for hepatitis C.  Osteoporosis screening.** / A one-time screening for women ages 65 and over and women at risk for fractures or osteoporosis.  Skin self-exam. / Monthly.  Influenza immunization.** / Every year.  Pneumococcal polysaccharide immunization.** / 1 dose at age 65 (or older) if you have never been vaccinated.  Tetanus, diphtheria, pertussis (Tdap, Td) immunization. / A one-time dose of Tdap vaccine if you are over   65 and have contact with an infant, are a healthcare worker, or simply want to be protected from whooping cough. After that, you need a Td booster dose every 10 years.  Varicella immunization.** / Consult your caregiver.  Meningococcal immunization.** / Consult your caregiver.  Hepatitis A immunization.** / Consult your caregiver. 2 doses, 6 to 18 months apart.  Hepatitis B immunization.** / Check with your caregiver. 3 doses, usually over 6 months. ** Family history and personal history of risk and conditions may change your caregiver's recommendations. Document Released: 02/08/2002 Document Revised: 03/06/2012  Document Reviewed: 05/10/2011 [employer] Patient Information 2013 [employer].  

## 2012-10-02 NOTE — Progress Notes (Signed)
Subjective:    Patient ID: Meredith Hayes, female    DOB: [birth year], 56 y.o.   MRN: [id number]  HPI  She returns for a complete physical - she feels well and offers no complaints.   Review of Systems  Constitutional: Negative.   HENT: Negative.   Eyes: Negative.   Respiratory: Negative.   Cardiovascular: Negative.   Gastrointestinal: Negative.   Genitourinary: Negative.   Musculoskeletal: Negative.   Skin: Negative.   Neurological: Negative.   Hematological: Negative.   Psychiatric/Behavioral: Negative.        Objective:   Physical Exam  Vitals reviewed. Constitutional: She is oriented to person, place, and time. She appears well-developed and well-nourished. No distress.  HENT:  Head: Normocephalic and atraumatic.  Mouth/Throat: Oropharynx is clear and moist. No oropharyngeal exudate.  Eyes: Conjunctivae normal are normal. Right eye exhibits no discharge. Left eye exhibits no discharge. No scleral icterus.  Neck: Normal range of motion. Neck supple. No JVD present. No tracheal deviation present. No thyromegaly present.  Cardiovascular: Normal rate, regular rhythm, normal heart sounds and intact distal pulses.  Exam reveals no gallop and no friction rub.   No murmur heard. Pulmonary/Chest: Effort normal and breath sounds normal. No stridor. No respiratory distress. She has no wheezes. She has no rales. She exhibits no tenderness.  Abdominal: Soft. Bowel sounds are normal. She exhibits no distension and no mass. There is no tenderness. There is no rebound and no guarding.  Musculoskeletal: Normal range of motion. She exhibits no edema and no tenderness.  Lymphadenopathy:    She has no cervical adenopathy.  Neurological: She is oriented to person, place, and time.  Skin: Skin is warm and dry. No rash noted. She is not diaphoretic. No erythema. No pallor.  Psychiatric: She has a normal mood and affect. Her behavior is normal. Judgment and thought content normal.       Lab Results  Component Value Date   WBC 8.6 10/01/2011   HGB 14.3 10/01/2011   HCT 42.7 10/01/2011   PLT 259.0 10/01/2011   GLUCOSE 80 10/01/2011   CHOL 207* 10/01/2011   TRIG 74.0 10/01/2011   HDL 47.80 10/01/2011   LDLDIRECT 145.9 10/01/2011   LDLCALC  Value: 137        Total Cholesterol/HDL:CHD Risk Coronary Heart Disease Risk Table                     Men   Women  1/2 Average Risk   3.4   3.3  Average Risk       5.0   4.4  2 X Average Risk   9.6   7.1  3 X Average Risk  23.4   11.0        Use the calculated Patient Ratio above and the CHD Risk Table to determine the patient's CHD Risk.        ATP III CLASSIFICATION (LDL):  <100     mg/dL   Optimal  053-976  mg/dL   Near or Above                    Optimal  130-159  mg/dL   Borderline  734-193  mg/dL   High  >790     mg/dL   Very High* 24/08/7352   ALT 26 10/01/2011   AST 22 10/01/2011   NA 140 10/01/2011   K 4.1 10/01/2011   CL 103 10/01/2011   CREATININE 0.6 10/01/2011   BUN 16  10/01/2011   CO2 30 10/01/2011   TSH 0.89 10/01/2011      Assessment & Plan:

## 2012-10-02 NOTE — Assessment & Plan Note (Signed)
Exam done, vaccines were addressed, labs ordered, pt ed material was given 

## 2012-10-03 ENCOUNTER — Telehealth: Payer: Self-pay | Admitting: Internal Medicine

## 2012-10-03 NOTE — Telephone Encounter (Signed)
Caller: [name]/Patient; Patient Name: Meredith Hayes; PCP: Sanda Linger (Adults only); Best Callback Phone Number: [phone] Had Well Check in office on 10/02/12 and given hard script for flu mist vacine since office is out. She took prescription to several Pharmacies and unavailable- they referrred her back to PCP as best source to obtain it. She would like call back when office has Mist in stock of if another [place] office has it. She needs to know where to get the vacine. Please call her back to let her know. Also refill on Xanax 0.5 mgs -was supposed to be sent to [facility] on [address].  Please have pharmacy call her when ready for pick up.

## 2012-10-03 NOTE — Telephone Encounter (Signed)
Checking other [place] sites to see if avail

## 2012-10-04 NOTE — Telephone Encounter (Signed)
Patient informed to call/go by [facility] to have flu mist administered. Patient was advised to ask for [facility]. Called pharmacy to verify rx to alprazolam ready

## 2012-10-13 ENCOUNTER — Ambulatory Visit (INDEPENDENT_AMBULATORY_CARE_PROVIDER_SITE_OTHER): Payer: BC Managed Care – PPO

## 2012-10-13 DIAGNOSIS — Z23 Encounter for immunization: Secondary | ICD-10-CM

## 2013-05-06 ENCOUNTER — Other Ambulatory Visit: Payer: Self-pay | Admitting: Internal Medicine

## 2013-09-03 ENCOUNTER — Encounter: Payer: Self-pay | Admitting: Internal Medicine

## 2013-09-03 ENCOUNTER — Telehealth: Payer: Self-pay | Admitting: *Deleted

## 2013-09-03 ENCOUNTER — Ambulatory Visit (INDEPENDENT_AMBULATORY_CARE_PROVIDER_SITE_OTHER): Payer: 59 | Admitting: Internal Medicine

## 2013-09-03 ENCOUNTER — Other Ambulatory Visit (INDEPENDENT_AMBULATORY_CARE_PROVIDER_SITE_OTHER): Payer: 59

## 2013-09-03 VITALS — BP 122/70 | HR 64 | Temp 98.7°F | Resp 16

## 2013-09-03 DIAGNOSIS — Z23 Encounter for immunization: Secondary | ICD-10-CM

## 2013-09-03 DIAGNOSIS — Z0000 Encounter for general adult medical examination without abnormal findings: Secondary | ICD-10-CM

## 2013-09-03 LAB — URINALYSIS, ROUTINE W REFLEX MICROSCOPIC
Leukocytes, UA: NEGATIVE
Nitrite: NEGATIVE
RBC / HPF: NONE SEEN (ref 0–?)
Specific Gravity, Urine: 1.015 (ref 1.000–1.030)
Urine Glucose: NEGATIVE
Urobilinogen, UA: 0.2 (ref 0.0–1.0)

## 2013-09-03 LAB — COMPREHENSIVE METABOLIC PANEL
Albumin: 3.7 g/dL (ref 3.5–5.2)
CO2: 29 mEq/L (ref 19–32)
Calcium: 9.1 mg/dL (ref 8.4–10.5)
Chloride: 108 mEq/L (ref 96–112)
GFR: 135.31 mL/min (ref >60.00)
Glucose, Bld: 91 mg/dL (ref 70–99)
Sodium: 142 mEq/L (ref 135–145)
Total Bilirubin: 0.6 mg/dL (ref 0.3–1.2)
Total Protein: 7.3 g/dL (ref 6.0–8.3)

## 2013-09-03 LAB — CBC WITH DIFFERENTIAL/PLATELET
Basophils Absolute: 0 10*3/uL (ref 0.0–0.1)
HCT: 42.5 % (ref 36.0–46.0)
Lymphs Abs: 1.8 10*3/uL (ref 0.7–4.0)
MCV: 86.8 fl (ref 78.0–100.0)
Monocytes Absolute: 0.5 10*3/uL (ref 0.1–1.0)
Neutro Abs: 3.1 10*3/uL (ref 1.4–7.7)
Platelets: 187 10*3/uL (ref 150.0–400.0)
RDW: 13.5 % (ref 11.5–14.6)

## 2013-09-03 LAB — LIPID PANEL
Cholesterol: 217 mg/dL — ABNORMAL HIGH (ref 0–200)
Total CHOL/HDL Ratio: 4

## 2013-09-03 LAB — TSH: TSH: 0.48 u[IU]/mL (ref 0.35–5.50)

## 2013-09-03 LAB — LDL CHOLESTEROL, DIRECT: Direct LDL: 153.7 mg/dL

## 2013-09-03 MED ORDER — INFLUENZA VIRUS VACCINE LIVE NA LIQD: 0.2000 mL | Freq: Once | NASAL | Status: DC

## 2013-09-03 NOTE — Patient Instructions (Signed)
Preventive Care for Adults, Female A healthy lifestyle and preventive care can promote health and wellness. Preventive health guidelines for women include the following key practices.  A routine yearly physical is a good way to check with your caregiver about your health and preventive screening. It is a chance to share any concerns and updates on your health, and to receive a thorough exam.  Visit your dentist for a routine exam and preventive care every 6 months. Brush your teeth twice a day and floss once a day. Good oral hygiene prevents tooth decay and gum disease.  The frequency of eye exams is based on your age, health, family medical history, use of contact lenses, and other factors. Follow your caregiver's recommendations for frequency of eye exams.  Eat a healthy diet. Foods like vegetables, fruits, whole grains, low-fat dairy products, and lean protein foods contain the nutrients you need without too many calories. Decrease your intake of foods high in solid fats, added sugars, and salt. Eat the right amount of calories for you.Get information about a proper diet from your caregiver, if necessary.  Regular physical exercise is one of the most important things you can do for your health. Most adults should get at least 150 minutes of moderate-intensity exercise (any activity that increases your heart rate and causes you to sweat) each week. In addition, most adults need muscle-strengthening exercises on 2 or more days a week.  Maintain a healthy weight. The body mass index (BMI) is a screening tool to identify possible weight problems. It provides an estimate of body fat based on height and weight. Your caregiver can help determine your BMI, and can help you achieve or maintain a healthy weight.For adults 20 years and older:  A BMI below 18.5 is considered underweight.  A BMI of 18.5 to 24.9 is normal.  A BMI of 25 to 29.9 is considered overweight.  A BMI of 30 and above is  considered obese.  Maintain normal blood lipids and cholesterol levels by exercising and minimizing your intake of saturated fat. Eat a balanced diet with plenty of fruit and vegetables. Blood tests for lipids and cholesterol should begin at age 20 and be repeated every 5 years. If your lipid or cholesterol levels are high, you are over 50, or you are at high risk for heart disease, you may need your cholesterol levels checked more frequently.Ongoing high lipid and cholesterol levels should be treated with medicines if diet and exercise are not effective.  If you smoke, find out from your caregiver how to quit. If you do not use tobacco, do not start.  If you are pregnant, do not drink alcohol. If you are breastfeeding, be very cautious about drinking alcohol. If you are not pregnant and choose to drink alcohol, do not exceed 1 drink per day. One drink is considered to be 12 ounces (355 mL) of beer, 5 ounces (148 mL) of wine, or 1.5 ounces (44 mL) of liquor.  Avoid use of street drugs. Do not share needles with anyone. Ask for help if you need support or instructions about stopping the use of drugs.  High blood pressure causes heart disease and increases the risk of stroke. Your blood pressure should be checked at least every 1 to 2 years. Ongoing high blood pressure should be treated with medicines if weight loss and exercise are not effective.  If you are 55 to 57 years old, ask your caregiver if you should take aspirin to prevent strokes.  Diabetes   screening involves taking a blood sample to check your fasting blood sugar level. This should be done once every 3 years, after age 45, if you are within normal weight and without risk factors for diabetes. Testing should be considered at a younger age or be carried out more frequently if you are overweight and have at least 1 risk factor for diabetes.  Breast cancer screening is essential preventive care for women. You should practice "breast  self-awareness." This means understanding the normal appearance and feel of your breasts and may include breast self-examination. Any changes detected, no matter how small, should be reported to a caregiver. Women in their 20s and 30s should have a clinical breast exam (CBE) by a caregiver as part of a regular health exam every 1 to 3 years. After age 40, women should have a CBE every year. Starting at age 40, women should consider having a mammography (breast X-ray test) every year. Women who have a family history of breast cancer should talk to their caregiver about genetic screening. Women at a high risk of breast cancer should talk to their caregivers about having magnetic resonance imaging (MRI) and a mammography every year.  The Pap test is a screening test for cervical cancer. A Pap test can show cell changes on the cervix that might become cervical cancer if left untreated. A Pap test is a procedure in which cells are obtained and examined from the lower end of the uterus (cervix).  Women should have a Pap test starting at age 21.  Between ages 21 and 29, Pap tests should be repeated every 2 years.  Beginning at age 30, you should have a Pap test every 3 years as long as the past 3 Pap tests have been normal.  Some women have medical problems that increase the chance of getting cervical cancer. Talk to your caregiver about these problems. It is especially important to talk to your caregiver if a new problem develops soon after your last Pap test. In these cases, your caregiver may recommend more frequent screening and Pap tests.  The above recommendations are the same for women who have or have not gotten the vaccine for human papillomavirus (HPV).  If you had a hysterectomy for a problem that was not cancer or a condition that could lead to cancer, then you no longer need Pap tests. Even if you no longer need a Pap test, a regular exam is a good idea to make sure no other problems are  starting.  If you are between ages 65 and 70, and you have had normal Pap tests going back 10 years, you no longer need Pap tests. Even if you no longer need a Pap test, a regular exam is a good idea to make sure no other problems are starting.  If you have had past treatment for cervical cancer or a condition that could lead to cancer, you need Pap tests and screening for cancer for at least 20 years after your treatment.  If Pap tests have been discontinued, risk factors (such as a new sexual partner) need to be reassessed to determine if screening should be resumed.  The HPV test is an additional test that may be used for cervical cancer screening. The HPV test looks for the virus that can cause the cell changes on the cervix. The cells collected during the Pap test can be tested for HPV. The HPV test could be used to screen women aged 30 years and older, and should   be used in women of any age who have unclear Pap test results. After the age of 30, women should have HPV testing at the same frequency as a Pap test.  Colorectal cancer can be detected and often prevented. Most routine colorectal cancer screening begins at the age of 50 and continues through age 75. However, your caregiver may recommend screening at an earlier age if you have risk factors for colon cancer. On a yearly basis, your caregiver may provide home test kits to check for hidden blood in the stool. Use of a small camera at the end of a tube, to directly examine the colon (sigmoidoscopy or colonoscopy), can detect the earliest forms of colorectal cancer. Talk to your caregiver about this at age 50, when routine screening begins. Direct examination of the colon should be repeated every 5 to 10 years through age 75, unless early forms of pre-cancerous polyps or small growths are found.  Hepatitis C blood testing is recommended for all people born from 1945 through 1965 and any individual with known risks for hepatitis C.  Practice  safe sex. Use condoms and avoid high-risk sexual practices to reduce the spread of sexually transmitted infections (STIs). STIs include gonorrhea, chlamydia, syphilis, trichomonas, herpes, HPV, and human immunodeficiency virus (HIV). Herpes, HIV, and HPV are viral illnesses that have no cure. They can result in disability, cancer, and death. Sexually active women aged 25 and younger should be checked for chlamydia. Older women with new or multiple partners should also be tested for chlamydia. Testing for other STIs is recommended if you are sexually active and at increased risk.  Osteoporosis is a disease in which the bones lose minerals and strength with aging. This can result in serious bone fractures. The risk of osteoporosis can be identified using a bone density scan. Women ages 65 and over and women at risk for fractures or osteoporosis should discuss screening with their caregivers. Ask your caregiver whether you should take a calcium supplement or vitamin D to reduce the rate of osteoporosis.  Menopause can be associated with physical symptoms and risks. Hormone replacement therapy is available to decrease symptoms and risks. You should talk to your caregiver about whether hormone replacement therapy is right for you.  Use sunscreen with sun protection factor (SPF) of 30 or more. Apply sunscreen liberally and repeatedly throughout the day. You should seek shade when your shadow is shorter than you. Protect yourself by wearing long sleeves, pants, a wide-brimmed hat, and sunglasses year round, whenever you are outdoors.  Once a month, do a whole body skin exam, using a mirror to look at the skin on your back. Notify your caregiver of new moles, moles that have irregular borders, moles that are larger than a pencil eraser, or moles that have changed in shape or color.  Stay current with required immunizations.  Influenza. You need a dose every fall (or winter). The composition of the flu vaccine  changes each year, so being vaccinated once is not enough.  Pneumococcal polysaccharide. You need 1 to 2 doses if you smoke cigarettes or if you have certain chronic medical conditions. You need 1 dose at age 65 (or older) if you have never been vaccinated.  Tetanus, diphtheria, pertussis (Tdap, Td). Get 1 dose of Tdap vaccine if you are younger than age 65, are over 65 and have contact with an infant, are a healthcare worker, are pregnant, or simply want to be protected from whooping cough. After that, you need a Td   booster dose every 10 years. Consult your caregiver if you have not had at least 3 tetanus and diphtheria-containing shots sometime in your life or have a deep or dirty wound.  HPV. You need this vaccine if you are a woman age 26 or younger. The vaccine is given in 3 doses over 6 months.  Measles, mumps, rubella (MMR). You need at least 1 dose of MMR if you were born in 1957 or later. You may also need a second dose.  Meningococcal. If you are age 19 to 21 and a first-year college student living in a residence hall, or have one of several medical conditions, you need to get vaccinated against meningococcal disease. You may also need additional booster doses.  Zoster (shingles). If you are age 60 or older, you should get this vaccine.  Varicella (chickenpox). If you have never had chickenpox or you were vaccinated but received only 1 dose, talk to your caregiver to find out if you need this vaccine.  Hepatitis A. You need this vaccine if you have a specific risk factor for hepatitis A virus infection or you simply wish to be protected from this disease. The vaccine is usually given as 2 doses, 6 to 18 months apart.  Hepatitis B. You need this vaccine if you have a specific risk factor for hepatitis B virus infection or you simply wish to be protected from this disease. The vaccine is given in 3 doses, usually over 6 months. Preventive Services / Frequency Ages 19 to 39  Blood  pressure check.** / Every 1 to 2 years.  Lipid and cholesterol check.** / Every 5 years beginning at age 20.  Clinical breast exam.** / Every 3 years for women in their 20s and 30s.  Pap test.** / Every 2 years from ages 21 through 29. Every 3 years starting at age 30 through age 65 or 70 with a history of 3 consecutive normal Pap tests.  HPV screening.** / Every 3 years from ages 30 through ages 65 to 70 with a history of 3 consecutive normal Pap tests.  Hepatitis C blood test.** / For any individual with known risks for hepatitis C.  Skin self-exam. / Monthly.  Influenza immunization.** / Every year.  Pneumococcal polysaccharide immunization.** / 1 to 2 doses if you smoke cigarettes or if you have certain chronic medical conditions.  Tetanus, diphtheria, pertussis (Tdap, Td) immunization. / A one-time dose of Tdap vaccine. After that, you need a Td booster dose every 10 years.  HPV immunization. / 3 doses over 6 months, if you are 26 and younger.  Measles, mumps, rubella (MMR) immunization. / You need at least 1 dose of MMR if you were born in 1957 or later. You may also need a second dose.  Meningococcal immunization. / 1 dose if you are age 19 to 21 and a first-year college student living in a residence hall, or have one of several medical conditions, you need to get vaccinated against meningococcal disease. You may also need additional booster doses.  Varicella immunization.** / Consult your caregiver.  Hepatitis A immunization.** / Consult your caregiver. 2 doses, 6 to 18 months apart.  Hepatitis B immunization.** / Consult your caregiver. 3 doses usually over 6 months. Ages 40 to 64  Blood pressure check.** / Every 1 to 2 years.  Lipid and cholesterol check.** / Every 5 years beginning at age 20.  Clinical breast exam.** / Every year after age 40.  Mammogram.** / Every year beginning at age 40   and continuing for as long as you are in good health. Consult with your  caregiver.  Pap test.** / Every 3 years starting at age 30 through age 65 or 70 with a history of 3 consecutive normal Pap tests.  HPV screening.** / Every 3 years from ages 30 through ages 65 to 70 with a history of 3 consecutive normal Pap tests.  Fecal occult blood test (FOBT) of stool. / Every year beginning at age 50 and continuing until age 75. You may not need to do this test if you get a colonoscopy every 10 years.  Flexible sigmoidoscopy or colonoscopy.** / Every 5 years for a flexible sigmoidoscopy or every 10 years for a colonoscopy beginning at age 50 and continuing until age 75.  Hepatitis C blood test.** / For all people born from 1945 through 1965 and any individual with known risks for hepatitis C.  Skin self-exam. / Monthly.  Influenza immunization.** / Every year.  Pneumococcal polysaccharide immunization.** / 1 to 2 doses if you smoke cigarettes or if you have certain chronic medical conditions.  Tetanus, diphtheria, pertussis (Tdap, Td) immunization.** / A one-time dose of Tdap vaccine. After that, you need a Td booster dose every 10 years.  Measles, mumps, rubella (MMR) immunization. / You need at least 1 dose of MMR if you were born in 1957 or later. You may also need a second dose.  Varicella immunization.** / Consult your caregiver.  Meningococcal immunization.** / Consult your caregiver.  Hepatitis A immunization.** / Consult your caregiver. 2 doses, 6 to 18 months apart.  Hepatitis B immunization.** / Consult your caregiver. 3 doses, usually over 6 months. Ages 65 and over  Blood pressure check.** / Every 1 to 2 years.  Lipid and cholesterol check.** / Every 5 years beginning at age 20.  Clinical breast exam.** / Every year after age 40.  Mammogram.** / Every year beginning at age 40 and continuing for as long as you are in good health. Consult with your caregiver.  Pap test.** / Every 3 years starting at age 30 through age 65 or 70 with a 3  consecutive normal Pap tests. Testing can be stopped between 65 and 70 with 3 consecutive normal Pap tests and no abnormal Pap or HPV tests in the past 10 years.  HPV screening.** / Every 3 years from ages 30 through ages 65 or 70 with a history of 3 consecutive normal Pap tests. Testing can be stopped between 65 and 70 with 3 consecutive normal Pap tests and no abnormal Pap or HPV tests in the past 10 years.  Fecal occult blood test (FOBT) of stool. / Every year beginning at age 50 and continuing until age 75. You may not need to do this test if you get a colonoscopy every 10 years.  Flexible sigmoidoscopy or colonoscopy.** / Every 5 years for a flexible sigmoidoscopy or every 10 years for a colonoscopy beginning at age 50 and continuing until age 75.  Hepatitis C blood test.** / For all people born from 1945 through 1965 and any individual with known risks for hepatitis C.  Osteoporosis screening.** / A one-time screening for women ages 65 and over and women at risk for fractures or osteoporosis.  Skin self-exam. / Monthly.  Influenza immunization.** / Every year.  Pneumococcal polysaccharide immunization.** / 1 dose at age 65 (or older) if you have never been vaccinated.  Tetanus, diphtheria, pertussis (Tdap, Td) immunization. / A one-time dose of Tdap vaccine if you are over   65 and have contact with an infant, are a healthcare worker, or simply want to be protected from whooping cough. After that, you need a Td booster dose every 10 years.  Varicella immunization.** / Consult your caregiver.  Meningococcal immunization.** / Consult your caregiver.  Hepatitis A immunization.** / Consult your caregiver. 2 doses, 6 to 18 months apart.  Hepatitis B immunization.** / Check with your caregiver. 3 doses, usually over 6 months. ** Family history and personal history of risk and conditions may change your caregiver's recommendations. Document Released: 02/08/2002 Document Revised: 03/06/2012  Document Reviewed: 05/10/2011 [employer] Patient Information 2014 [employer].  

## 2013-09-03 NOTE — Telephone Encounter (Signed)
Pt called states her pharmacy does not carry the Flu Mist.  Further states she received it from [facility] location last year.  Attempted to contact [facility] to check availability with no answer.  Please advise

## 2013-09-03 NOTE — Telephone Encounter (Signed)
Left detailed message on pts VM stating flu mist is on backorder and scheduled to be delivered mid to end Sept.

## 2013-09-03 NOTE — Telephone Encounter (Signed)
That is fine with me.

## 2013-09-04 NOTE — Assessment & Plan Note (Signed)
Exam done Vaccines were reviewed- Rx for flu vax sent to her pharmacy Labs ordered Pt ed material was given

## 2013-09-04 NOTE — Progress Notes (Signed)
  Subjective:    Patient ID: Meredith Hayes, female    DOB: 05-11-1956, 57 y.o.   MRN: [id number]  HPI  She returns for a physical and tells me that she feels well and offers no complaints.  Review of Systems  All other systems reviewed and are negative.       Objective:   Physical Exam  Vitals reviewed. Constitutional: She is oriented to person, place, and time. She appears well-developed and well-nourished. No distress.  HENT:  Head: Normocephalic and atraumatic.  Mouth/Throat: Oropharynx is clear and moist. No oropharyngeal exudate.  Eyes: Conjunctivae are normal. Right eye exhibits no discharge. Left eye exhibits no discharge. No scleral icterus.  Neck: Normal range of motion. Neck supple. No JVD present. No tracheal deviation present. No thyromegaly present.  Cardiovascular: Normal rate, regular rhythm, normal heart sounds and intact distal pulses.  Exam reveals no gallop and no friction rub.   No murmur heard. Pulmonary/Chest: Effort normal and breath sounds normal. No stridor. No respiratory distress. She has no wheezes. She has no rales. She exhibits no tenderness.  Abdominal: Soft. Bowel sounds are normal. She exhibits no distension and no mass. There is no tenderness. There is no rebound and no guarding.  Musculoskeletal: Normal range of motion. She exhibits no edema and no tenderness.  Lymphadenopathy:    She has no cervical adenopathy.  Neurological: She is oriented to person, place, and time.  Skin: Skin is warm and dry. No rash noted. She is not diaphoretic. No erythema. No pallor.  Psychiatric: She has a normal mood and affect. Her behavior is normal. Judgment and thought content normal.     Lab Results  Component Value Date   WBC 5.7 09/03/2013   HGB 14.4 09/03/2013   HCT 42.5 09/03/2013   PLT 187.0 09/03/2013   GLUCOSE 91 09/03/2013   CHOL 217* 09/03/2013   TRIG 75.0 09/03/2013   HDL 54.80 09/03/2013   LDLDIRECT 153.7 09/03/2013   LDLCALC 118* 10/02/2012   ALT 20 09/03/2013    AST 21 09/03/2013   NA 142 09/03/2013   K 4.2 09/03/2013   CL 108 09/03/2013   CREATININE 0.5 09/03/2013   BUN 18 09/03/2013   CO2 29 09/03/2013   TSH 0.48 09/03/2013       Assessment & Plan:

## 2013-11-01 ENCOUNTER — Other Ambulatory Visit: Payer: Self-pay

## 2013-11-07 ENCOUNTER — Telehealth: Payer: Self-pay | Admitting: Internal Medicine

## 2013-11-07 ENCOUNTER — Telehealth: Payer: Self-pay | Admitting: *Deleted

## 2013-11-07 NOTE — Telephone Encounter (Signed)
Pt called states she was given an Rx for Flumist due to an allergy, further states she was told she would be called once [facility] location received Flumist.  Pt states she was never called, however her neighbors children recently received Flumist at [place].  I called [facility] while pt was on hold , spoke with [place].  Tonya confirmed Flumist was available she advised me their Flu Clinic was today and tomorrow and pt would need appointment for same.  Returned to pts call advised her of Tonya's message and offered [facility]'s phone number.  Pt at that time asked to speak with practice manager states she was not able to go to [place] on Wed or Thurs.

## 2013-11-07 NOTE — Telephone Encounter (Signed)
Pt called [facility], transferred to [facility].  I attempted to return pts call, no answer; left message for pt to return call to [facility] P[facility].

## 2013-11-07 NOTE — Telephone Encounter (Addendum)
Opened in error

## 2013-11-09 ENCOUNTER — Telehealth: Payer: Self-pay | Admitting: Family Medicine

## 2013-11-09 ENCOUNTER — Ambulatory Visit: Payer: 59 | Admitting: Family Medicine

## 2013-11-09 ENCOUNTER — Telehealth: Payer: Self-pay | Admitting: Internal Medicine

## 2013-11-09 NOTE — Telephone Encounter (Addendum)
11/09/2013   Pt called in very upset.  Pt stated that she has been trying to get the flu mist for 6 months and states that our office does not cary the mist.  Pt states that she spoke with Truddie Hidden and was sent to [facility] to get the mist, but they refused to treat pt because of history of COPD.  Pt wants to know why she was instructed to have the flu mist when she is unable to have it due to the COPD.  Pt wants Dr. Yetta Barre to contact her as to why this happened.  Pt also wants to speak with a manager again regarding this whole scenario.  Pt wants whoever to call her to please leave a direct call back # so pt can reach them directly.

## 2013-11-09 NOTE — Telephone Encounter (Signed)
Meredith Hayes spoke to her yesterday, I don't have anything else to say about this

## 2013-11-09 NOTE — Telephone Encounter (Signed)
11/09/2013 Pt called in very upset. Pt stated that she has been trying to get the flu mist for 6 months and states that our office does not cary the mist. Pt states that she spoke with Truddie Hidden 11/08/2013 and was sent to [facility] to get the mist, but they refused to treat pt because of history of COPD. Pt wants to know why she was instructed to have the flu mist when she is unable to have it due to the COPD. Pt wants Dr. Yetta Barre to contact her as to why this happened. Pt also wants to speak with a manager again regarding this whole scenario. Pt wants whoever to call her to please leave a direct call back # so pt can reach them directly.

## 2013-11-09 NOTE — Telephone Encounter (Signed)
Patient came into the office today to receive a flumist.  Looked at patient's chart.  Criteria is that the pt is below the age of 18 and that they have no respiratory issues.  Pt did not meet either.  Pt is older that 89 and has been diagnosed with COPD.  Discussed with Dr. Kriste Basque who advised against giving the vaccine.  Spoke with Print production planner.  Was advised that the pt should report to her office to seek authorization from PCP for vaccine.  Will send over a flumist if necessary through [facility].

## 2013-11-12 ENCOUNTER — Ambulatory Visit (INDEPENDENT_AMBULATORY_CARE_PROVIDER_SITE_OTHER): Payer: 59

## 2013-11-12 ENCOUNTER — Telehealth: Payer: Self-pay | Admitting: Internal Medicine

## 2013-11-12 DIAGNOSIS — Z23 Encounter for immunization: Secondary | ICD-10-CM

## 2013-11-12 NOTE — Telephone Encounter (Signed)
Pt called stated that she is coming today 11/12/13 at 11 am to get the flumist. Pt also request a letter to her employer stating why she is late getting this flumist. Please advise.

## 2013-11-27 ENCOUNTER — Other Ambulatory Visit: Payer: Self-pay | Admitting: Internal Medicine

## 2013-12-03 ENCOUNTER — Telehealth: Payer: Self-pay | Admitting: *Deleted

## 2013-12-03 NOTE — Telephone Encounter (Signed)
Pt called upset that her Xanax refill was refused due to pt needing appoint, pt states she was just here in September, 2014.  Further states her insurance will not cover her OV every 3 months.  Please advise

## 2013-12-03 NOTE — Telephone Encounter (Signed)
Spoke with pt advised of MDs message 

## 2013-12-03 NOTE — Telephone Encounter (Signed)
Encourage her to wean off of and eventually stop taking this med

## 2015-05-02 ENCOUNTER — Other Ambulatory Visit (HOSPITAL_COMMUNITY): Payer: Self-pay | Admitting: Obstetrics and Gynecology

## 2015-05-05 LAB — CYTOLOGY - PAP

## 2015-05-21 ENCOUNTER — Inpatient Hospital Stay (HOSPITAL_COMMUNITY)
Admission: EM | Admit: 2015-05-21 | Discharge: 2015-05-23 | DRG: 419 | Disposition: A | Discharge status: Home / Self Care | Payer: 59 | Attending: General Surgery | Admitting: General Surgery

## 2015-05-21 ENCOUNTER — Emergency Department (HOSPITAL_COMMUNITY): Payer: 59

## 2015-05-21 ENCOUNTER — Encounter (HOSPITAL_COMMUNITY): Payer: Self-pay | Admitting: Emergency Medicine

## 2015-05-21 DIAGNOSIS — K8000 Calculus of gallbladder with acute cholecystitis without obstruction: Secondary | ICD-10-CM | POA: Diagnosis present

## 2015-05-21 DIAGNOSIS — Z87442 Personal history of urinary calculi: Secondary | ICD-10-CM

## 2015-05-21 DIAGNOSIS — Z885 Allergy status to narcotic agent status: Secondary | ICD-10-CM

## 2015-05-21 DIAGNOSIS — R1011 Right upper quadrant pain: Secondary | ICD-10-CM

## 2015-05-21 DIAGNOSIS — Z87891 Personal history of nicotine dependence: Secondary | ICD-10-CM | POA: Diagnosis not present

## 2015-05-21 DIAGNOSIS — Z880 Allergy status to penicillin: Secondary | ICD-10-CM | POA: Diagnosis not present

## 2015-05-21 DIAGNOSIS — K810 Acute cholecystitis: Secondary | ICD-10-CM | POA: Diagnosis present

## 2015-05-21 DIAGNOSIS — J449 Chronic obstructive pulmonary disease, unspecified: Secondary | ICD-10-CM | POA: Diagnosis present

## 2015-05-21 DIAGNOSIS — K819 Cholecystitis, unspecified: Secondary | ICD-10-CM

## 2015-05-21 DIAGNOSIS — K219 Gastro-esophageal reflux disease without esophagitis: Secondary | ICD-10-CM | POA: Diagnosis present

## 2015-05-21 DIAGNOSIS — I10 Essential (primary) hypertension: Secondary | ICD-10-CM | POA: Diagnosis present

## 2015-05-21 DIAGNOSIS — F329 Major depressive disorder, single episode, unspecified: Secondary | ICD-10-CM | POA: Diagnosis present

## 2015-05-21 LAB — LIPASE, BLOOD: LIPASE: 18 U/L — AB (ref 22–51)

## 2015-05-21 LAB — COMPREHENSIVE METABOLIC PANEL
ALK PHOS: 69 U/L (ref 38–126)
ALT: 27 U/L (ref 14–54)
ANION GAP: 8 (ref 5–15)
AST: 27 U/L (ref 15–41)
Albumin: 3.7 g/dL (ref 3.5–5.0)
BILIRUBIN TOTAL: 0.8 mg/dL (ref 0.3–1.2)
BUN: 12 mg/dL (ref 6–20)
CALCIUM: 9.2 mg/dL (ref 8.9–10.3)
CO2: 28 mmol/L (ref 22–32)
CREATININE: 0.65 mg/dL (ref 0.44–1.00)
Chloride: 105 mmol/L (ref 101–111)
GFR calc Af Amer: >60 mL/min (ref >60)
GFR calc non Af Amer: >60 mL/min (ref >60)
GLUCOSE: 98 mg/dL (ref 65–99)
Potassium: 4.3 mmol/L (ref 3.5–5.1)
Sodium: 141 mmol/L (ref 135–145)
TOTAL PROTEIN: 7.5 g/dL (ref 6.5–8.1)

## 2015-05-21 LAB — SURGICAL PCR SCREEN
MRSA, PCR: NEGATIVE
Staphylococcus aureus: NEGATIVE

## 2015-05-21 LAB — CBC WITH DIFFERENTIAL/PLATELET
BASOS ABS: 0 10*3/uL (ref 0.0–0.1)
BASOS PCT: 0 % (ref 0–1)
EOS ABS: 0.1 10*3/uL (ref 0.0–0.7)
Eosinophils Relative: 1 % (ref 0–5)
HCT: 41.7 % (ref 36.0–46.0)
Hemoglobin: 14.3 g/dL (ref 12.0–15.0)
LYMPHS ABS: 1.7 10*3/uL (ref 0.7–4.0)
Lymphocytes Relative: 20 % (ref 12–46)
MCH: 30.2 pg (ref 26.0–34.0)
MCHC: 34.3 g/dL (ref 30.0–36.0)
MCV: 88.2 fL (ref 78.0–100.0)
MONOS PCT: 6 % (ref 3–12)
Monocytes Absolute: 0.6 10*3/uL (ref 0.1–1.0)
NEUTROS ABS: 6.2 10*3/uL (ref 1.7–7.7)
Neutrophils Relative %: 73 % (ref 43–77)
PLATELETS: 228 10*3/uL (ref 150–400)
RBC: 4.73 MIL/uL (ref 3.87–5.11)
RDW: 13.3 % (ref 11.5–15.5)
WBC: 8.6 10*3/uL (ref 4.0–10.5)

## 2015-05-21 LAB — URINALYSIS, ROUTINE W REFLEX MICROSCOPIC
Bilirubin Urine: NEGATIVE
Glucose, UA: NEGATIVE mg/dL
HGB URINE DIPSTICK: NEGATIVE
Ketones, ur: >80 mg/dL — AB
Nitrite: NEGATIVE
Protein, ur: NEGATIVE mg/dL
Specific Gravity, Urine: 1.02 (ref 1.005–1.030)
Urobilinogen, UA: 1 mg/dL (ref 0.0–1.0)
pH: 7 (ref 5.0–8.0)

## 2015-05-21 LAB — URINE MICROSCOPIC-ADD ON

## 2015-05-21 MED ORDER — SODIUM CHLORIDE 0.9 % IV BOLUS (SEPSIS): 1000.0000 mL | Freq: Once | INTRAVENOUS | Status: DC

## 2015-05-21 MED ORDER — MORPHINE SULFATE 4 MG/ML IJ SOLN
4.0000 mg | Freq: Once | INTRAMUSCULAR | Status: AC
  Administered 2015-05-21: 4 mg via INTRAVENOUS
  Filled 2015-05-21: qty 1

## 2015-05-21 MED ORDER — HYDROMORPHONE HCL 1 MG/ML IJ SOLN
1.0000 mg | Freq: Once | INTRAMUSCULAR | Status: AC
  Administered 2015-05-21: 1 mg via INTRAVENOUS
  Filled 2015-05-21: qty 1

## 2015-05-21 MED ORDER — SODIUM CHLORIDE 0.9 % IV BOLUS (SEPSIS)
1000.0000 mL | Freq: Once | INTRAVENOUS | Status: AC
  Administered 2015-05-21: 1000 mL via INTRAVENOUS

## 2015-05-21 MED ORDER — ONDANSETRON HCL 4 MG/2ML IJ SOLN
4.0000 mg | Freq: Four times a day (QID) | INTRAMUSCULAR | Status: DC | PRN
  Administered 2015-05-21 – 2015-05-22 (×2): 4 mg via INTRAVENOUS
  Filled 2015-05-21 (×2): qty 2

## 2015-05-21 MED ORDER — ACETAMINOPHEN 325 MG PO TABS: 650.0000 mg | ORAL_TABLET | Freq: Four times a day (QID) | ORAL | Status: DC | PRN

## 2015-05-21 MED ORDER — KCL IN DEXTROSE-NACL 20-5-0.45 MEQ/L-%-% IV SOLN
INTRAVENOUS | Status: DC
  Administered 2015-05-21 – 2015-05-22 (×2): via INTRAVENOUS
  Filled 2015-05-21 (×4): qty 1000

## 2015-05-21 MED ORDER — DIPHENHYDRAMINE HCL 12.5 MG/5ML PO ELIX: 12.5000 mg | ORAL_SOLUTION | Freq: Four times a day (QID) | ORAL | Status: DC | PRN

## 2015-05-21 MED ORDER — METRONIDAZOLE IN NACL 5-0.79 MG/ML-% IV SOLN
500.0000 mg | Freq: Three times a day (TID) | INTRAVENOUS | Status: DC
  Administered 2015-05-21 – 2015-05-22 (×3): 500 mg via INTRAVENOUS
  Filled 2015-05-21 (×4): qty 100

## 2015-05-21 MED ORDER — HYDROMORPHONE HCL 1 MG/ML IJ SOLN: 1.0000 mg | Freq: Once | INTRAMUSCULAR | Status: AC

## 2015-05-21 MED ORDER — DIPHENHYDRAMINE HCL 50 MG/ML IJ SOLN: 12.5000 mg | Freq: Four times a day (QID) | INTRAMUSCULAR | Status: DC | PRN

## 2015-05-21 MED ORDER — CLINDAMYCIN PHOSPHATE 600 MG/50ML IV SOLN
600.0000 mg | Freq: Three times a day (TID) | INTRAVENOUS | Status: DC
  Administered 2015-05-21 – 2015-05-22 (×2): 600 mg via INTRAVENOUS
  Filled 2015-05-21 (×4): qty 50

## 2015-05-21 MED ORDER — FESOTERODINE FUMARATE ER 4 MG PO TB24
4.0000 mg | ORAL_TABLET | Freq: Every day | ORAL | Status: DC
  Administered 2015-05-21: 4 mg via ORAL
  Filled 2015-05-21 (×3): qty 1

## 2015-05-21 MED ORDER — MORPHINE SULFATE 2 MG/ML IJ SOLN
1.0000 mg | INTRAMUSCULAR | Status: DC | PRN
  Administered 2015-05-21 – 2015-05-22 (×6): 4 mg via INTRAVENOUS
  Administered 2015-05-22: 2 mg via INTRAVENOUS
  Filled 2015-05-21 (×2): qty 2
  Filled 2015-05-21: qty 1
  Filled 2015-05-21 (×4): qty 2

## 2015-05-21 MED ORDER — DEXTROSE 5 % IV SOLN
1.0000 g | Freq: Three times a day (TID) | INTRAVENOUS | Status: DC
  Administered 2015-05-21: 1 g via INTRAVENOUS
  Filled 2015-05-21 (×2): qty 1

## 2015-05-21 MED ORDER — PANTOPRAZOLE SODIUM 40 MG IV SOLR
40.0000 mg | Freq: Every day | INTRAVENOUS | Status: DC
  Administered 2015-05-21 – 2015-05-22 (×2): 40 mg via INTRAVENOUS
  Filled 2015-05-21 (×2): qty 40

## 2015-05-21 MED ORDER — ONDANSETRON HCL 4 MG/2ML IJ SOLN: 4.0000 mg | Freq: Once | INTRAMUSCULAR | Status: AC

## 2015-05-21 MED ORDER — ONDANSETRON HCL 4 MG/2ML IJ SOLN
4.0000 mg | Freq: Once | INTRAMUSCULAR | Status: AC
  Administered 2015-05-21: 4 mg via INTRAVENOUS
  Filled 2015-05-21: qty 2

## 2015-05-21 MED ORDER — ACETAMINOPHEN 650 MG RE SUPP: 650.0000 mg | Freq: Four times a day (QID) | RECTAL | Status: DC | PRN

## 2015-05-21 NOTE — ED Provider Notes (Signed)
CSN: [id number]     Arrival date & time 05/21/15  1124 History   First MD Initiated Contact with Patient 05/21/15 1254     Chief Complaint  Patient presents with  . Abdominal Pain     (Consider location/radiation/quality/duration/timing/severity/associated sxs/prior Treatment) Patient is a 59 y.o. female presenting with abdominal pain. The history is provided by the patient (pt complains of pain in the abdomen since last night).  Abdominal Pain Pain location:  Epigastric Pain quality: aching   Pain radiates to:  Does not radiate Pain severity:  Moderate Onset quality:  Sudden Timing:  Constant Progression:  Worsening Chronicity:  New Context: not alcohol use   Associated symptoms: no chest pain, no cough, no diarrhea, no fatigue and no hematuria     Past Medical History  Diagnosis Date  . ANXIETY 11/15/2008  . COPD 11/15/2008  . DEPRESSIVE DISORDER 12/12/2008  . ELEVATED BLOOD PRESSURE WITHOUT DIAGNOSIS OF HYPERTENSION 11/14/2008  . GERD 11/15/2008  . 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 SYNCOPE 10/10/2009   Past Surgical History  Procedure Laterality Date  . Colonoscopy  12/09/11   Family History  Problem Relation Age of Onset  . Arthritis Other   . Hypertension Other   . Kidney cancer Father   . Kidney cancer Sister   . Kidney cancer Paternal Grandfather   . Colon cancer Paternal Grandmother    History  Substance Use Topics  . Smoking status: Former Smoker -- 1.00 packs/day for 35 years    Types: Cigarettes    Quit date: 05/01/2007  . Smokeless tobacco: Never Used     Comment: quit smoking March 2008  . Alcohol Use: 1.2 oz/week    2 Cans of beer per week   OB History    No data available     Review of Systems  Constitutional: Negative for appetite change and fatigue.  HENT: Negative for congestion, ear discharge and sinus pressure.   Eyes: Negative for discharge.  Respiratory: Negative for cough.   Cardiovascular: Negative for chest pain.   Gastrointestinal: Positive for abdominal pain. Negative for diarrhea.  Genitourinary: Negative for frequency and hematuria.  Musculoskeletal: Negative for back pain.  Skin: Negative for rash.  Neurological: Negative for seizures and headaches.  Psychiatric/Behavioral: Negative for hallucinations.      Allergies  Penicillins; Ciprofloxacin hcl; Codeine; and Influenza vaccine live  Home Medications   Prior to Admission medications   Medication Sig Start Date End Date Taking? Authorizing Provider  acetaminophen (TYLENOL) 325 MG tablet Take 650 mg by mouth every 6 (six) hours as needed.     Yes Historical Provider, MD  ALPRAZolam Duanne Moron) 0.5 MG tablet TAKE 1 TABLET BY MOUTH EVERY DAY AS NEEDED FOR ANXIETY 05/06/13  Yes Janith Lima, MD  Biotin 5000 MCG TABS Take 5,000 mcg by mouth daily.   Yes Historical Provider, MD  CALCIUM PO Take 1,000 mg by mouth daily.   Yes Historical Provider, MD  Cholecalciferol (VITAMIN D PO) Take 200 Units by mouth daily.   Yes Historical Provider, MD  DOCUSATE SODIUM PO Take 300 mg by mouth daily.   Yes Historical Provider, MD  doxycycline (VIBRAMYCIN) 100 MG capsule Take 100 mg by mouth daily.  11/16/11  Yes Historical Provider, MD  fluticasone (FLONASE) 50 MCG/ACT nasal spray Place 1-2 sprays into both nostrils daily as needed for allergies or rhinitis.   Yes Historical Provider, MD  METRONIDAZOLE, TOPICAL, 0.75 % LOTN Apply 1 application topically 2 (two) times daily.   Yes  Historical Provider, MD  Multiple Vitamins-Minerals (MULTIVITAMIN ADULTS 50+) TABS Take 1 tablet by mouth 2 (two) times daily.   Yes Historical Provider, MD  Niacinamide-Zinc-Copper-FA (TL NICOTINAMIDE PO) Take 250 mg by mouth daily.   Yes Historical Provider, MD  OMEGA-3 KRILL OIL PO Take 350 mg by mouth every other day.   Yes Historical Provider, MD  OVER THE COUNTER MEDICATION Take 50 mg by mouth daily. OTC Pterostilbene   Yes Historical Provider, MD  oxyCODONE (ROXICODONE) 5 MG/5ML  solution Take 15 mg by mouth every 4 (four) hours as needed for severe pain.   Yes Historical Provider, MD  Probiotic Product (PROBIOTIC PO) Take 1 capsule by mouth daily. OTC Senior Probiotic 6 Billion CFU   Yes Historical Provider, MD  Sennosides 17.2 MG TABS Take 17.2 mg by mouth daily.   Yes Historical Provider, MD  tolterodine (DETROL LA) 4 MG 24 hr capsule Take 4 mg by mouth daily.   Yes Historical Provider, MD  ursodiol (ACTIGALL) 300 MG capsule Take 300 mg by mouth 2 (two) times daily.   Yes Historical Provider, MD  vitamin B-12 (CYANOCOBALAMIN) 1000 MCG tablet Take 1,000 mcg by mouth daily.   Yes Historical Provider, MD  VITAMIN K PO Take 40 mcg by mouth daily.   Yes Historical Provider, MD   BP 106/50 mmHg  Pulse 64  Temp(Src) 98.2 F (36.8 C) (Oral)  Resp 16  SpO2 100% Physical Exam  Constitutional: She is oriented to person, place, and time. She appears well-developed.  HENT:  Head: Normocephalic.  Eyes: Conjunctivae and EOM are normal. No scleral icterus.  Neck: Neck supple. No thyromegaly present.  Cardiovascular: Normal rate and regular rhythm.  Exam reveals no gallop and no friction rub.   No murmur heard. Pulmonary/Chest: No stridor. She has no wheezes. She has no rales. She exhibits no tenderness.  Abdominal: She exhibits no distension. There is tenderness. There is no rebound.  Tender ruq  Musculoskeletal: Normal range of motion. She exhibits no edema.  Lymphadenopathy:    She has no cervical adenopathy.  Neurological: She is oriented to person, place, and time. She exhibits normal muscle tone. Coordination normal.  Skin: No rash noted. No erythema.  Psychiatric: She has a normal mood and affect. Her behavior is normal.    ED Course  Procedures (including critical care time) Labs Review Labs Reviewed  LIPASE, BLOOD - Abnormal; Notable for the following:    Lipase 18 (*)    All other components within normal limits  CBC WITH DIFFERENTIAL/PLATELET   COMPREHENSIVE METABOLIC PANEL  URINALYSIS, ROUTINE W REFLEX MICROSCOPIC    Imaging Review US Abdomen Complete  05/21/2015   CLINICAL DATA:  Right upper quadrant pain  EXAM: ULTRASOUND ABDOMEN COMPLETE  COMPARISON:  10/05/2011  FINDINGS: Gallbladder: Cholelithiasis with gallbladder wall thickening measuring 6.8 mm. No pericholecystic fluid. Negative sonographic Murphy sign.  Common bile duct: Diameter: 14.4 mm.  No choledocholithiasis.  Liver: No focal lesion identified. Within normal limits in parenchymal echogenicity.  IVC: No abnormality visualized.  Pancreas: Visualized portion unremarkable.  Spleen: Size and appearance within normal limits.  Right Kidney: Length: 12.9 cm. Echogenicity within normal limits. No mass or hydronephrosis visualized.  Left Kidney: Length: 11.6 cm. Echogenicity within normal limits. No mass or hydronephrosis visualized.  Abdominal aorta: No aneurysm visualized.  Other findings: None.  IMPRESSION: 1. Cholelithiasis with gallbladder wall thickening, but negative sonographic Murphy sign. The appearance is concerning acute cholecystitis. Common bile duct dilatation without choledocholithiasis. If there is further  clinical concern, recommend evaluation with MRCP.   Electronically Signed   By: Kathreen Devoid   On: 05/21/2015 15:31     EKG Interpretation None      MDM   Final diagnoses:  RUQ pain  Cholecystitis    Surgery to see pt    Milton Ferguson, MD 05/21/15 [phone]

## 2015-05-21 NOTE — ED Notes (Signed)
Pt returned from US

## 2015-05-21 NOTE — ED Notes (Signed)
Pt leaving for US.

## 2015-05-21 NOTE — ED Notes (Signed)
Dinner tray ordered clear liquid 

## 2015-05-21 NOTE — ED Notes (Signed)
Attempted report Xs 1.  

## 2015-05-21 NOTE — ED Notes (Signed)
Pt states she unable to urinate because she cannot hold fluids down, Pt is aware urine is needed for testing.

## 2015-05-21 NOTE — ED Notes (Signed)
Pt sts RUQ abd pain around to flank with N/V

## 2015-05-21 NOTE — H&P (Signed)
Meredith Hayes 02/09/1956  [id number].      Chief Complaint/Reason for Consult: abdominal pain HPI: This is a 59 yo white female with a history of a gastric sleeve in 2013 who has had some intermittent mild abdominal pain in her epigastrium since surgery.  She awoke around 0200am with epigastric and RUQ abdominal pain.  She had some nausea and vomiting.  She had some chills, but no fevers.  She took a pain pill last night, but her pain persisted.  She knew she had some gallstones from a prior CT scan.  Modifying factors include; pain medication.  no aggravating or alleviating factors.  Characterized as sharp shooting pain.  Location is RUQ with radiation to the back.  Time pattern is constant.  Severe in severity.  She came to the [facility] where she had an US that reveals gallstones with gallbladder wall thickening.  Her labs are all normal.  We have been asked to see her for admission  Review of Systems  All other systems reviewed and are negative.    Family History  Problem Relation Age of Onset  . Arthritis Other   . Hypertension Other   . Kidney cancer Father   . Kidney cancer Sister   . Kidney cancer Paternal Grandfather   . Colon cancer Paternal Grandmother     Past Medical History  Diagnosis Date  . ANXIETY 11/15/2008  . COPD 11/15/2008  . DEPRESSIVE DISORDER 12/12/2008  . ELEVATED BLOOD PRESSURE WITHOUT DIAGNOSIS OF HYPERTENSION 11/14/2008  . GERD 11/15/2008  . 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 SYNCOPE 10/10/2009    Past Surgical History  Procedure Laterality Date  . Colonoscopy  12/09/11    Social History:  reports that she quit smoking about 8 years ago. Her smoking use included Cigarettes. She has a 35 pack-year smoking history. She has never used smokeless tobacco. She reports that she drinks about 1.2 oz of alcohol per week. She reports that she does not use illicit drugs.  Allergies:  Allergies  Allergen Reactions  . Penicillins Anaphylaxis  . Ciprofloxacin  Hcl Other (See Comments)    IV Cipro -- Redness and burning  . Codeine Nausea And Vomiting  . Influenza Vaccine Live     Red, swollen sight     (Not in a hospital admission)  Blood pressure 106/50, pulse 64, temperature 98.2 F (36.8 C), temperature source Oral, resp. rate 16, SpO2 100 %. Physical Exam:  General: pleasant, WD, WN white female who is laying in bed in NAD HEENT: head is normocephalic, atraumatic.  Sclera are noninjected.  PERRL.  Ears and nose without any masses or lesions.  Mouth is pink and moist Heart: regular, rate, and rhythm.  Normal s1,s2. No obvious murmurs, gallops, or rubs noted.  Palpable radial and pedal pulses bilaterally Lungs: CTAB, no wheezes, rhonchi, or rales noted.  Respiratory effort nonlabored Abd: soft, TTP RUQ, ND, +BS, no masses, hernias, or organomegaly MS: all 4 extremities are symmetrical with no cyanosis, clubbing, or edema. Skin: warm and dry with no masses, lesions, or rashes Psych: A&Ox3 with an appropriate affect.    Results for orders placed or performed during the hospital encounter of 05/21/15 (from the past 48 hour(s))  CBC with Differential     Status: None   Collection Time: 05/21/15 11:48 AM  Result Value Ref Range   WBC 8.6 4.0 - 10.5 K/uL   RBC 4.73 3.87 - 5.11 MIL/uL   Hemoglobin 14.3 12.0 - 15.0 g/dL   HCT   41.7 36.0 - 46.0 %   MCV 88.2 78.0 - 100.0 fL   MCH 30.2 26.0 - 34.0 pg   MCHC 34.3 30.0 - 36.0 g/dL   RDW 13.3 11.5 - 15.5 %   Platelets 228 150 - 400 K/uL   Neutrophils Relative % 73 43 - 77 %   Neutro Abs 6.2 1.7 - 7.7 K/uL   Lymphocytes Relative 20 12 - 46 %   Lymphs Abs 1.7 0.7 - 4.0 K/uL   Monocytes Relative 6 3 - 12 %   Monocytes Absolute 0.6 0.1 - 1.0 K/uL   Eosinophils Relative 1 0 - 5 %   Eosinophils Absolute 0.1 0.0 - 0.7 K/uL   Basophils Relative 0 0 - 1 %   Basophils Absolute 0.0 0.0 - 0.1 K/uL  Comprehensive metabolic panel     Status: None   Collection Time: 05/21/15 11:48 AM  Result Value Ref  Range   Sodium 141 135 - 145 mmol/L   Potassium 4.3 3.5 - 5.1 mmol/L   Chloride 105 101 - 111 mmol/L   CO2 28 22 - 32 mmol/L   Glucose, Bld 98 65 - 99 mg/dL   BUN 12 6 - 20 mg/dL   Creatinine, Ser 0.65 0.44 - 1.00 mg/dL   Calcium 9.2 8.9 - 10.3 mg/dL   Total Protein 7.5 6.5 - 8.1 g/dL   Albumin 3.7 3.5 - 5.0 g/dL   AST 27 15 - 41 U/L   ALT 27 14 - 54 U/L   Alkaline Phosphatase 69 38 - 126 U/L   Total Bilirubin 0.8 0.3 - 1.2 mg/dL   GFR calc non Af Amer >60 >60 mL/min   GFR calc Af Amer >60 >60 mL/min    Comment: (NOTE) The eGFR has been calculated using the CKD EPI equation. This calculation has not been validated in all clinical situations. eGFR's persistently <60 mL/min signify possible Chronic Kidney Disease.    Anion gap 8 5 - 15  Lipase, blood     Status: Abnormal   Collection Time: 05/21/15 11:48 AM  Result Value Ref Range   Lipase 18 (L) 22 - 51 U/L   US Abdomen Complete  05/21/2015   CLINICAL DATA:  Right upper quadrant pain  EXAM: ULTRASOUND ABDOMEN COMPLETE  COMPARISON:  10/05/2011  FINDINGS: Gallbladder: Cholelithiasis with gallbladder wall thickening measuring 6.8 mm. No pericholecystic fluid. Negative sonographic Murphy sign.  Common bile duct: Diameter: 14.4 mm.  No choledocholithiasis.  Liver: No focal lesion identified. Within normal limits in parenchymal echogenicity.  IVC: No abnormality visualized.  Pancreas: Visualized portion unremarkable.  Spleen: Size and appearance within normal limits.  Right Kidney: Length: 12.9 cm. Echogenicity within normal limits. No mass or hydronephrosis visualized.  Left Kidney: Length: 11.6 cm. Echogenicity within normal limits. No mass or hydronephrosis visualized.  Abdominal aorta: No aneurysm visualized.  Other findings: None.  IMPRESSION: 1. Cholelithiasis with gallbladder wall thickening, but negative sonographic Murphy sign. The appearance is concerning acute cholecystitis. Common bile duct dilatation without choledocholithiasis.  If there is further clinical concern, recommend evaluation with MRCP.   Electronically Signed   By: Kathreen Devoid   On: 05/21/2015 15:31       Assessment/Plan Acute cholecystitis  -admit  -lap chole in AM -aztreonam/flagyl  -may have clears, NPO after midnight -IVF -pain control/anti-emetics -SCD/lovenox   05/21/2015, 4:02 PM

## 2015-05-22 ENCOUNTER — Inpatient Hospital Stay (HOSPITAL_COMMUNITY): Payer: 59 | Admitting: Anesthesiology

## 2015-05-22 ENCOUNTER — Encounter (HOSPITAL_COMMUNITY): Admission: EM | Disposition: A | Discharge status: Home / Self Care | Payer: Self-pay | Source: Home / Self Care

## 2015-05-22 ENCOUNTER — Encounter (HOSPITAL_COMMUNITY): Payer: Self-pay | Admitting: Anesthesiology

## 2015-05-22 HISTORY — PX: CHOLECYSTECTOMY: SHX55

## 2015-05-22 SURGERY — LAPAROSCOPIC CHOLECYSTECTOMY
Anesthesia: General | Site: Abdomen

## 2015-05-22 MED ORDER — NEOSTIGMINE METHYLSULFATE 10 MG/10ML IV SOLN
INTRAVENOUS | Status: AC
  Filled 2015-05-22: qty 1

## 2015-05-22 MED ORDER — LACTATED RINGERS IV SOLN
INTRAVENOUS | Status: DC
  Administered 2015-05-22: 50 mL/h via INTRAVENOUS
  Administered 2015-05-22 (×2): via INTRAVENOUS

## 2015-05-22 MED ORDER — PROPOFOL 10 MG/ML IV BOLUS
INTRAVENOUS | Status: AC
Start: 2015-05-22 — End: 2015-05-22
  Filled 2015-05-22: qty 20

## 2015-05-22 MED ORDER — SODIUM CHLORIDE 0.9 % IR SOLN
Status: DC | PRN
  Administered 2015-05-22: 1000 mL

## 2015-05-22 MED ORDER — DEXTROSE 5 % IV SOLN
1.0000 g | Freq: Three times a day (TID) | INTRAVENOUS | Status: AC
  Administered 2015-05-22 – 2015-05-23 (×3): 1 g via INTRAVENOUS
  Filled 2015-05-22 (×3): qty 1

## 2015-05-22 MED ORDER — DEXAMETHASONE SODIUM PHOSPHATE 4 MG/ML IJ SOLN
INTRAMUSCULAR | Status: DC | PRN
  Administered 2015-05-22: 8 mg via INTRAVENOUS

## 2015-05-22 MED ORDER — ROCURONIUM BROMIDE 50 MG/5ML IV SOLN
INTRAVENOUS | Status: AC
  Filled 2015-05-22: qty 2

## 2015-05-22 MED ORDER — GLYCOPYRROLATE 0.2 MG/ML IJ SOLN
INTRAMUSCULAR | Status: DC | PRN
  Administered 2015-05-22: 0.6 mg via INTRAVENOUS

## 2015-05-22 MED ORDER — ONDANSETRON HCL 4 MG/2ML IJ SOLN
INTRAMUSCULAR | Status: AC
  Filled 2015-05-22: qty 2

## 2015-05-22 MED ORDER — MIDAZOLAM HCL 5 MG/5ML IJ SOLN
INTRAMUSCULAR | Status: DC | PRN
  Administered 2015-05-22 (×3): 0.5 mg via INTRAVENOUS

## 2015-05-22 MED ORDER — DEXAMETHASONE SODIUM PHOSPHATE 4 MG/ML IJ SOLN
INTRAMUSCULAR | Status: AC
  Filled 2015-05-22: qty 2

## 2015-05-22 MED ORDER — NEOSTIGMINE METHYLSULFATE 10 MG/10ML IV SOLN
INTRAVENOUS | Status: DC | PRN
  Administered 2015-05-22: 4 mg via INTRAVENOUS

## 2015-05-22 MED ORDER — LIDOCAINE HCL (CARDIAC) 20 MG/ML IV SOLN
INTRAVENOUS | Status: DC | PRN
  Administered 2015-05-22: 40 mg via INTRAVENOUS

## 2015-05-22 MED ORDER — AZTREONAM 1 G IJ SOLR
1.0000 g | Freq: Three times a day (TID) | INTRAMUSCULAR | Status: DC
  Filled 2015-05-22 (×3): qty 1

## 2015-05-22 MED ORDER — HYDROCODONE-ACETAMINOPHEN 5-325 MG PO TABS
1.0000 | ORAL_TABLET | ORAL | Status: DC | PRN
  Administered 2015-05-22 – 2015-05-23 (×4): 2 via ORAL
  Filled 2015-05-22 (×4): qty 2

## 2015-05-22 MED ORDER — PROPOFOL 10 MG/ML IV BOLUS
INTRAVENOUS | Status: DC | PRN
  Administered 2015-05-22: 160 mg via INTRAVENOUS

## 2015-05-22 MED ORDER — PHENYLEPHRINE HCL 10 MG/ML IJ SOLN
INTRAMUSCULAR | Status: DC | PRN
  Administered 2015-05-22: 40 ug via INTRAVENOUS
  Administered 2015-05-22 (×2): 80 ug via INTRAVENOUS
  Administered 2015-05-22: 40 ug via INTRAVENOUS
  Administered 2015-05-22 (×2): 80 ug via INTRAVENOUS

## 2015-05-22 MED ORDER — METRONIDAZOLE IN NACL 5-0.79 MG/ML-% IV SOLN
500.0000 mg | Freq: Three times a day (TID) | INTRAVENOUS | Status: AC
Start: 2015-05-22 — End: 2015-05-23
  Administered 2015-05-22 – 2015-05-23 (×2): 500 mg via INTRAVENOUS
  Filled 2015-05-22 (×2): qty 100

## 2015-05-22 MED ORDER — HYDROMORPHONE HCL 1 MG/ML IJ SOLN
0.2500 mg | INTRAMUSCULAR | Status: DC | PRN
  Administered 2015-05-22 (×2): 0.5 mg via INTRAVENOUS

## 2015-05-22 MED ORDER — 0.9 % SODIUM CHLORIDE (POUR BTL) OPTIME
TOPICAL | Status: DC | PRN
Start: 2015-05-22 — End: 2015-05-22
  Administered 2015-05-22: 1000 mL

## 2015-05-22 MED ORDER — BUPIVACAINE-EPINEPHRINE (PF) 0.25% -1:200000 IJ SOLN
INTRAMUSCULAR | Status: AC
  Filled 2015-05-22: qty 30

## 2015-05-22 MED ORDER — BUPIVACAINE-EPINEPHRINE 0.25% -1:200000 IJ SOLN
INTRAMUSCULAR | Status: DC | PRN
  Administered 2015-05-22: 16 mL

## 2015-05-22 MED ORDER — MIDAZOLAM HCL 2 MG/2ML IJ SOLN
INTRAMUSCULAR | Status: AC
  Filled 2015-05-22: qty 2

## 2015-05-22 MED ORDER — ROCURONIUM BROMIDE 100 MG/10ML IV SOLN
INTRAVENOUS | Status: DC | PRN
  Administered 2015-05-22: 40 mg via INTRAVENOUS
  Administered 2015-05-22: 20 mg via INTRAVENOUS

## 2015-05-22 MED ORDER — HYDROMORPHONE HCL 1 MG/ML IJ SOLN
INTRAMUSCULAR | Status: AC
  Filled 2015-05-22: qty 1

## 2015-05-22 MED ORDER — FENTANYL CITRATE (PF) 250 MCG/5ML IJ SOLN
INTRAMUSCULAR | Status: AC
Start: 2015-05-22 — End: 2015-05-22
  Filled 2015-05-22: qty 5

## 2015-05-22 MED ORDER — PHENYLEPHRINE 40 MCG/ML (10ML) SYRINGE FOR IV PUSH (FOR BLOOD PRESSURE SUPPORT)
PREFILLED_SYRINGE | INTRAVENOUS | Status: AC
  Filled 2015-05-22: qty 10

## 2015-05-22 MED ORDER — ARTIFICIAL TEARS OP OINT
TOPICAL_OINTMENT | OPHTHALMIC | Status: AC
  Filled 2015-05-22: qty 3.5

## 2015-05-22 MED ORDER — FENTANYL CITRATE (PF) 100 MCG/2ML IJ SOLN
INTRAMUSCULAR | Status: DC | PRN
  Administered 2015-05-22: 50 ug via INTRAVENOUS
  Administered 2015-05-22: 25 ug via INTRAVENOUS
  Administered 2015-05-22: 50 ug via INTRAVENOUS
  Administered 2015-05-22: 25 ug via INTRAVENOUS

## 2015-05-22 MED ORDER — LIDOCAINE HCL (CARDIAC) 20 MG/ML IV SOLN
INTRAVENOUS | Status: AC
  Filled 2015-05-22: qty 5

## 2015-05-22 MED ORDER — GLYCOPYRROLATE 0.2 MG/ML IJ SOLN
INTRAMUSCULAR | Status: AC
  Filled 2015-05-22: qty 3

## 2015-05-22 SURGICAL SUPPLY — 44 items
APPLIER CLIP 5 13 M/L LIGAMAX5 (MISCELLANEOUS) ×3
APR CLP MED LRG 5 ANG JAW (MISCELLANEOUS) ×2
BAG SPEC RTRVL LRG 6X4 10 (ENDOMECHANICALS)
BLADE SURG ROTATE 9660 (MISCELLANEOUS) IMPLANT
CANISTER SUCTION 2500CC (MISCELLANEOUS) ×3 IMPLANT
CHLORAPREP W/TINT 26ML (MISCELLANEOUS) ×3 IMPLANT
CLIP APPLIE 5 13 M/L LIGAMAX5 (MISCELLANEOUS) ×2 IMPLANT
COVER MAYO STAND STRL (DRAPES) ×1 IMPLANT
COVER SURGICAL LIGHT HANDLE (MISCELLANEOUS) ×3 IMPLANT
DRAPE C-ARM 42X72 X-RAY (DRAPES) ×1 IMPLANT
DRAPE LAPAROSCOPIC ABDOMINAL (DRAPES) ×3 IMPLANT
DRSG TEGADERM 2-3/8X2-3/4 SM (GAUZE/BANDAGES/DRESSINGS) ×12 IMPLANT
ELECT REM PT RETURN 9FT ADLT (ELECTROSURGICAL) ×3
ELECTRODE REM PT RTRN 9FT ADLT (ELECTROSURGICAL) ×2 IMPLANT
GLOVE BIO SURGEON STRL SZ7 (GLOVE) ×2 IMPLANT
GLOVE BIOGEL PI IND STRL 7.0 (GLOVE) ×2 IMPLANT
GLOVE BIOGEL PI IND STRL 8 (GLOVE) ×3 IMPLANT
GLOVE BIOGEL PI INDICATOR 7.0 (GLOVE) ×2
GLOVE BIOGEL PI INDICATOR 8 (GLOVE) ×2
GLOVE ECLIPSE 7.0 STRL STRAW (GLOVE) ×2 IMPLANT
GLOVE ECLIPSE 7.5 STRL STRAW (GLOVE) ×3 IMPLANT
GLOVE SURG SS PI 7.0 STRL IVOR (GLOVE) ×6 IMPLANT
GOWN STRL REUS W/ TWL LRG LVL3 (GOWN DISPOSABLE) ×7 IMPLANT
GOWN STRL REUS W/TWL LRG LVL3 (GOWN DISPOSABLE) ×12
KIT BASIN OR (CUSTOM PROCEDURE TRAY) ×3 IMPLANT
KIT ROOM TURNOVER OR (KITS) ×3 IMPLANT
LIQUID BAND (GAUZE/BANDAGES/DRESSINGS) ×3 IMPLANT
NS IRRIG 1000ML POUR BTL (IV SOLUTION) ×3 IMPLANT
PAD ARMBOARD 7.5X6 YLW CONV (MISCELLANEOUS) ×3 IMPLANT
POUCH SPECIMEN RETRIEVAL 10MM (ENDOMECHANICALS) IMPLANT
SCISSORS LAP 5X35 DISP (ENDOMECHANICALS) ×3 IMPLANT
SET CHOLANGIOGRAPH 5 50 .035 (SET/KITS/TRAYS/PACK) ×1 IMPLANT
SET IRRIG TUBING LAPAROSCOPIC (IRRIGATION / IRRIGATOR) ×3 IMPLANT
SLEEVE ENDOPATH XCEL 5M (ENDOMECHANICALS) ×6 IMPLANT
SPECIMEN JAR SMALL (MISCELLANEOUS) ×3 IMPLANT
STRIP CLOSURE SKIN 1/2X4 (GAUZE/BANDAGES/DRESSINGS) ×2 IMPLANT
SUT MNCRL AB 4-0 PS2 18 (SUTURE) ×3 IMPLANT
SUT VICRYL 0 UR6 27IN ABS (SUTURE) ×6 IMPLANT
TOWEL OR 17X24 6PK STRL BLUE (TOWEL DISPOSABLE) ×3 IMPLANT
TOWEL OR 17X26 10 PK STRL BLUE (TOWEL DISPOSABLE) IMPLANT
TRAY LAPAROSCOPIC (CUSTOM PROCEDURE TRAY) ×3 IMPLANT
TROCAR XCEL BLUNT TIP 100MML (ENDOMECHANICALS) ×3 IMPLANT
TROCAR XCEL NON-BLD 5MMX100MML (ENDOMECHANICALS) ×3 IMPLANT
TUBING INSUFFLATION (TUBING) ×3 IMPLANT

## 2015-05-22 NOTE — Anesthesia Postprocedure Evaluation (Signed)
  Anesthesia Post-op Note  Patient: Meredith Hayes  Procedure(s) Performed: Procedure(s): LAPAROSCOPIC CHOLECYSTECTOMY (N/A)  Patient Location: PACU  Anesthesia Type:General  Level of Consciousness: awake, alert  and oriented  Airway and Oxygen Therapy: Patient Spontanous Breathing and Patient connected to nasal cannula oxygen  Post-op Pain: mild  Post-op Assessment: Post-op Vital signs reviewed, Patient's Cardiovascular Status Stable, Respiratory Function Stable, Patent Airway and Pain level controlled  Post-op Vital Signs: stable  Last Vitals:  Filed Vitals:   05/22/15 1416  BP: 99/57  Pulse: 75  Temp: 36.9 C  Resp: 14    Complications: No apparent anesthesia complications

## 2015-05-22 NOTE — Progress Notes (Signed)
Patient left unit at this time, going down to surgery.

## 2015-05-22 NOTE — Anesthesia Preprocedure Evaluation (Addendum)
Anesthesia Evaluation  Patient identified by MRN, date of birth, ID band Patient awake    Reviewed: Allergy & Precautions, NPO status , Patient's Chart, lab work & pertinent test results  Airway Mallampati: II  TM Distance: >3 FB Neck ROM: Full    Dental  (+) Teeth Intact, Dental Advisory Given   Pulmonary COPDformer smoker,  breath sounds clear to auscultation        Cardiovascular Rhythm:Regular Rate:Normal     Neuro/Psych    GI/Hepatic GERD-  Medicated and Controlled,  Endo/Other    Renal/GU      Musculoskeletal   Abdominal   Peds  Hematology   Anesthesia Other Findings   Reproductive/Obstetrics                            Anesthesia Physical Anesthesia Plan  ASA: II  Anesthesia Plan: General   Post-op Pain Management:    Induction: Intravenous  Airway Management Planned: Oral ETT  Additional Equipment:   Intra-op Plan:   Post-operative Plan: Extubation in OR  Informed Consent: I have reviewed the patients History and Physical, chart, labs and discussed the procedure including the risks, benefits and alternatives for the proposed anesthesia with the patient or authorized representative who has indicated his/her understanding and acceptance.   Dental advisory given  Plan Discussed with: CRNA and Anesthesiologist  Anesthesia Plan Comments: (Symptomatic cholelithiasis Anxiety  Plan GA with oral ETT  Roberts Gaudy)        Anesthesia Quick Evaluation

## 2015-05-22 NOTE — Transfer of Care (Signed)
Immediate Anesthesia Transfer of Care Note  Patient: Meredith Hayes  Procedure(s) Performed: Procedure(s): LAPAROSCOPIC CHOLECYSTECTOMY (N/A)  Patient Location: PACU  Anesthesia Type:General  Level of Consciousness: awake, alert  and patient cooperative  Airway & Oxygen Therapy: Patient Spontanous Breathing and Patient connected to nasal cannula oxygen  Post-op Assessment: Report given to RN, Post -op Vital signs reviewed and stable and Patient moving all extremities  Post vital signs: Reviewed and stable  Last Vitals:  Filed Vitals:   05/22/15 1148  BP:   Pulse: 90  Temp:   Resp: 20    Complications: No apparent anesthesia complications

## 2015-05-22 NOTE — Op Note (Signed)
OPERATIVE REPORT  DATE OF OPERATION: 05/21/2015 - 05/22/2015  PATIENT:  Meredith Hayes  59 y.o. female  PRE-OPERATIVE DIAGNOSIS:  Acute Cholecystitis  POST-OPERATIVE DIAGNOSIS:  Acute Cholecystitis  PROCEDURE:  Procedure(s): LAPAROSCOPIC CHOLECYSTECTOMY  SURGEON:  Surgeon(s): Judeth Horn, MD  ASSISTANT: Maxwell Caul, PA-C  ANESTHESIA:   general  EBL: 50 ml  BLOOD ADMINISTERED: none  DRAINS: none   SPECIMEN:  Source of Specimen:  Gallbladder and contents  COUNTS CORRECT:  YES  PROCEDURE DETAILS: The patient was taken to the operating room and placed on the table in the supine position.  After an adequate endotracheal anesthetic was administered, the patient was prepped with ChloroPrep, and then draped in the usual manner exposing the entire abdomen laterally, inferiorly and up  to the costal margins.  After a proper timeout was performed including identifying the patient and the procedure to be performed, a supraumbilical 6.0YT midline incision was made using a #15 blade.  This was taken down to the fascia which was then incised with a #15 blade.  The edges of the fascia were tented up with Kocher clamps as the preperitoneal space was penetrated with a Kelly clamp into the peritoneum.  Once this was done, a pursestring suture of 0 Vicryl was passed around the fascial opening.  This was subsequently used to secure the [facility] cannula which was passed into the peritoneal cavity.  Once the [facility] cannula was in place, carbon dioxide gas was insufflated into the peritoneal cavity up to a maximal intra-abdominal pressure of 41mm Hg.The laparoscope, with attached camera and light source, was passed into the peritoneal cavity to visualize the direct insertion of two right upper quadrant 72mm cannulas, and a sup-xiphoid 35mm cannula.  Once all cannulas were in place, the dissection was begun.  Two ratcheted graspers were attached to the dome and infundibulum of the gallbladder and retracted towards  the anterior abdominal wall and the right upper quadrant.  Using cautery attached to a dissecting forceps, the peritoneum overlaying the triangle of Chalot and the hepatoduodenal triangle was dissected away exposing the cystic duct and the cystic artery.  The cystic artery was clipped proximally and distally then transected.  A clip was placed on the gallbladder side of the cystic duct, then the distal cystic duct was clipped multiple times then transected between the clips.  The gallbladder was then dissected out of the hepatic bed with some difficulty because of the intense inflammation.  It was retrieved from the abdomen (using an EndoCatch bag) requiring enlargement of the fascial opening and multiple vicryl stitches to repair the opening.  There was some contamination of the umbilical incision site with residue from the gallbladder.  Once the gallbladder was removed, the bed was inspected for hemostasis.  Once excellent hemostasis was obtained all gas and fluids were aspirated from above the liver, then the cannulas were removed.  The supraumbilical incision was closed using the pursestring suture which was in place.  0.25% bupivicaine with epinephrine was injected at all sites.  All 105mm or greater cannula sites were close using a running subcuticular stitch of 4-0 Monocryl.  5.47mm cannula sites were closed with Dermabond only.Steri-Strips and Tagaderm were used to complete the dressings at all sites.  At this point all needle, sponge, and instrument counts were correct.The patient was awakened from anesthesia and taken to the PACU in stable condition.   PATIENT DISPOSITION:  PACU - hemodynamically stable.   [name] 5/26/201611:29 AM

## 2015-05-22 NOTE — Anesthesia Procedure Notes (Signed)
Procedure Name: Intubation Date/Time: 05/22/2015 9:46 AM Performed by: Terrill Mohr Pre-anesthesia Checklist: Patient identified, Emergency Drugs available, Suction available and Patient being monitored Patient Re-evaluated:Patient Re-evaluated prior to inductionOxygen Delivery Method: Circle system utilized Preoxygenation: Pre-oxygenation with 100% oxygen Intubation Type: IV induction Ventilation: Mask ventilation without difficulty Laryngoscope Size: Mac and 3 Grade View: Grade II Tube type: Oral Tube size: 7.5 mm Number of attempts: 1 Airway Equipment and Method: Stylet Placement Confirmation: ETT inserted through vocal cords under direct vision,  breath sounds checked- equal and bilateral and positive ETCO2 Secured at: 21 (cm at teeth) cm Tube secured with: Tape Dental Injury: Teeth and Oropharynx as per pre-operative assessment

## 2015-05-22 NOTE — Progress Notes (Signed)
Patient requested her IV fluids currently on LR at 50 mL/hr to be discontinued and saline lock her IV since she's taking POs well, tolerating regular diet and ambulating.  On-call [facility] MD paged and received new order to discontinue LR IV.

## 2015-05-23 ENCOUNTER — Encounter (HOSPITAL_COMMUNITY): Payer: Self-pay | Admitting: General Surgery

## 2015-05-23 MED ORDER — HYDROCODONE-ACETAMINOPHEN 5-325 MG PO TABS: 1.0000 | ORAL_TABLET | ORAL | Status: DC | PRN

## 2015-05-23 NOTE — Discharge Instructions (Signed)
[facility] ______[facility] °LAPAROSCOPIC SURGERY: POST OP INSTRUCTIONS °Always review your discharge instruction sheet given to you by the facility where your surgery was performed. °IF YOU HAVE DISABILITY OR FAMILY LEAVE FORMS, YOU MUST BRING THEM TO THE OFFICE FOR PROCESSING.   °DO NOT GIVE THEM TO YOUR DOCTOR. ° °1. A prescription for pain medication may be given to you upon discharge.  Take your pain medication as prescribed, if needed.  If narcotic pain medicine is not needed, then you may take acetaminophen (Tylenol) or ibuprofen (Advil) as needed. °2. Take your usually prescribed medications unless otherwise directed. °3. If you need a refill on your pain medication, please contact your pharmacy.  They will contact our office to request authorization. Prescriptions will not be filled after 5pm or on week-ends. °4. You should follow a light diet the first few days after arrival home, such as soup and crackers, etc.  Be sure to include lots of fluids daily. °5. Most patients will experience some swelling and bruising in the area of the incisions.  Ice packs will help.  Swelling and bruising can take several days to resolve.  °6. It is common to experience some constipation if taking pain medication after surgery.  Increasing fluid intake and taking a stool softener (such as Colace) will usually help or prevent this problem from occurring.  A mild laxative (Milk of Magnesia or Miralax) should be taken according to package instructions if there are no bowel movements after 48 hours. °7. Unless discharge instructions indicate otherwise, you may remove your bandages 24-48 hours after surgery, and you may shower at that time.  You may have steri-strips (small skin tapes) in place directly over the incision.  These strips should be left on the skin for 7-10 days.  If your surgeon used skin glue on the incision, you may shower in 24 hours.  The glue will flake off over the next 2-3 weeks.  Any sutures or  staples will be removed at the office during your follow-up visit. °8. ACTIVITIES:  You may resume regular (light) daily activities beginning the next day--such as daily self-care, walking, climbing stairs--gradually increasing activities as tolerated.  You may have sexual intercourse when it is comfortable.  Refrain from any heavy lifting or straining until approved by your doctor. °a. You may drive when you are no longer taking prescription pain medication, you can comfortably wear a seatbelt, and you can safely maneuver your car and apply brakes. °b. RETURN TO WORK:  __________________________________________________________ °9. You should see your doctor in the office for a follow-up appointment approximately 2-3 weeks after your surgery.  Make sure that you call for this appointment within a day or two after you arrive home to insure a convenient appointment time. °10. OTHER INSTRUCTIONS: __________________________________________________________________________________________________________________________ __________________________________________________________________________________________________________________________ °WHEN TO CALL YOUR DOCTOR: °1. Fever over 101.0 °2. Inability to urinate °3. Continued bleeding from incision. °4. Increased pain, redness, or drainage from the incision. °5. Increasing abdominal pain ° °The clinic staff is available to answer your questions during regular business hours.  Please don’t hesitate to call and ask to speak to one of the nurses for clinical concerns.  If you have a medical emergency, go to the nearest emergency room or call 911.  A surgeon from [facility] is always on call at the hospital. °[address], [address], [place], [place]  [address] ? [address] [address], [place], [place]   [address] °[phone] ? [phone] ? FAX [phone] °Web site:   [email or web address] °

## 2015-05-23 NOTE — Discharge Summary (Signed)
Patient ID: Meredith Hayes MRN: [id number] DOB/AGE: 07/21/1956 59 y.o.  Admit date: 05/21/2015 Discharge date: 05/23/2015  Procedures: lap chole no IOC  Consults: None  Reason for Admission: This is a 59 yo white female with a history of a gastric sleeve in 2013 who has had some intermittent mild abdominal pain in her epigastrium since surgery. She awoke around 0200am with epigastric and RUQ abdominal pain. She had some nausea and vomiting. She had some chills, but no fevers. She took a pain pill last night, but her pain persisted. She knew she had some gallstones from a prior CT scan. Modifying factors include; pain medication. no aggravating or alleviating factors. Characterized as sharp shooting pain. Location is RUQ with radiation to the back. Time pattern is constant. Severe in severity. She came to the [facility] where she had an [place] that reveals gallstones with gallbladder wall thickening. Her labs are all normal. We have been asked to see her for admission  Admission Diagnoses:  1. Acute cholecysitis  Hospital Course: The patient was admitted and started on IV abx therapy.  The following day she was taken to the OR where she underwent a lap chole.  She tolerated this well.  On POD 1, she is tolerating a regular diet and her pain is well controlled.  She is stable for dc home  PE: Abd: soft, appropriately tender, +BS, incisions c/d/i  Discharge Diagnoses:  Active Problems:   Acute cholecystitis s/p lap chole  Discharge Medications:   Medication List    TAKE these medications        acetaminophen 325 MG tablet  Commonly known as:  TYLENOL  Take 650 mg by mouth every 6 (six) hours as needed.     ALPRAZolam 0.5 MG tablet  Commonly known as:  XANAX  TAKE 1 TABLET BY MOUTH EVERY DAY AS NEEDED FOR ANXIETY     Biotin 5000 MCG Tabs  Take 5,000 mcg by mouth daily.     CALCIUM PO  Take 1,000 mg by mouth daily.     DOCUSATE SODIUM PO  Take 300 mg by mouth daily.     doxycycline 100 MG capsule  Commonly known as:  VIBRAMYCIN  Take 100 mg by mouth daily.     fluticasone 50 MCG/ACT nasal spray  Commonly known as:  FLONASE  Place 1-2 sprays into both nostrils daily as needed for allergies or rhinitis.     HYDROcodone-acetaminophen 5-325 MG per tablet  Commonly known as:  NORCO/VICODIN  Take 1-2 tablets by mouth every 4 (four) hours as needed for moderate pain or severe pain.     METRONIDAZOLE (TOPICAL) 0.75 % Lotn  Apply 1 application topically 2 (two) times daily.     MULTIVITAMIN ADULTS 50+ Tabs  Take 1 tablet by mouth 2 (two) times daily.     OMEGA-3 KRILL OIL PO  Take 350 mg by mouth every other day.     OVER THE COUNTER MEDICATION  Take 50 mg by mouth daily. OTC Pterostilbene     oxyCODONE 5 MG/5ML solution  Commonly known as:  ROXICODONE  Take 15 mg by mouth every 4 (four) hours as needed for severe pain.     PROBIOTIC PO  Take 1 capsule by mouth daily. OTC Senior Probiotic 6 Billion CFU     Sennosides 17.2 MG Tabs  Take 17.2 mg by mouth daily.     TL NICOTINAMIDE PO  Take 250 mg by mouth daily.     tolterodine 4 MG 24  hr capsule  Commonly known as:  DETROL LA  Take 4 mg by mouth daily.     ursodiol 300 MG capsule  Commonly known as:  ACTIGALL  Take 300 mg by mouth 2 (two) times daily.     vitamin B-12 1000 MCG tablet  Commonly known as:  CYANOCOBALAMIN  Take 1,000 mcg by mouth daily.     VITAMIN D PO  Take 200 Units by mouth daily.     VITAMIN K PO  Take 40 mcg by mouth daily.        Discharge Instructions:     Follow-up Information    Follow up with [facility] On 06/10/2015.   Specialty:  General Surgery   Why:  Doc of the [facility], 2:45pm, arrive no later than 2:15pm for paperwork   Contact information:   [address] [place] [address] [phone]       Signed: Henreitta Cea 05/23/2015, 8:40 AM

## 2015-10-15 IMAGING — US US ABDOMEN COMPLETE
1 series · 14 of 25 positions shown · non-contrast
Comparison: 10/05/2011

CLINICAL DATA: Right upper quadrant pain

EXAM:
ULTRASOUND ABDOMEN COMPLETE

[Series 1: us abdomen complete · 0.22mm/px · 14 of 91 slices shown]
[im 1/91]
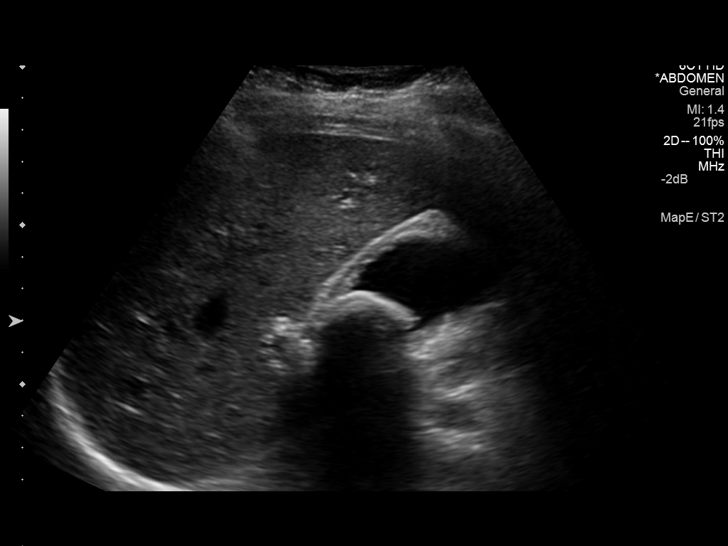
[im 8/91]
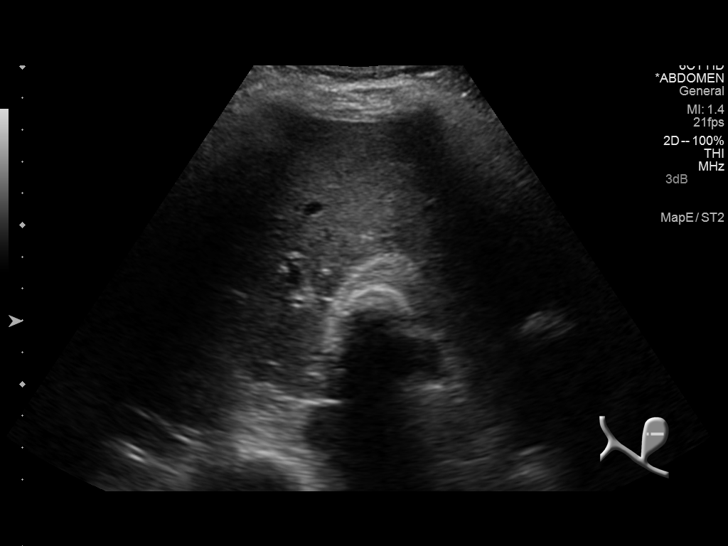
[im 16/91]
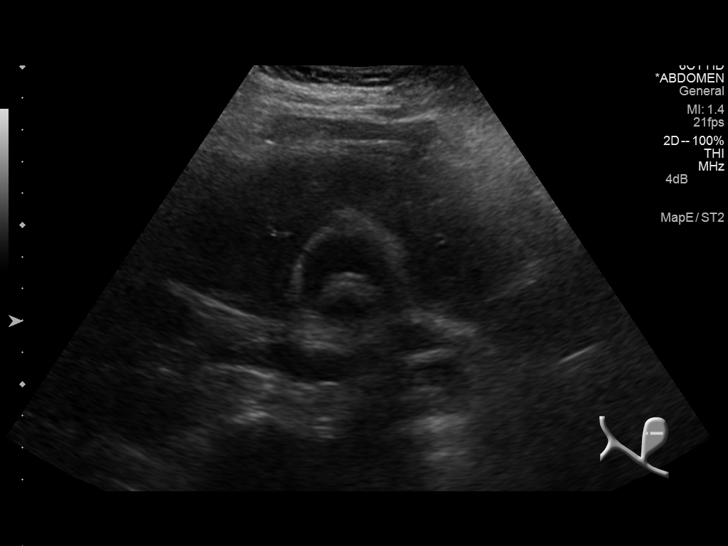
[im 23/91]
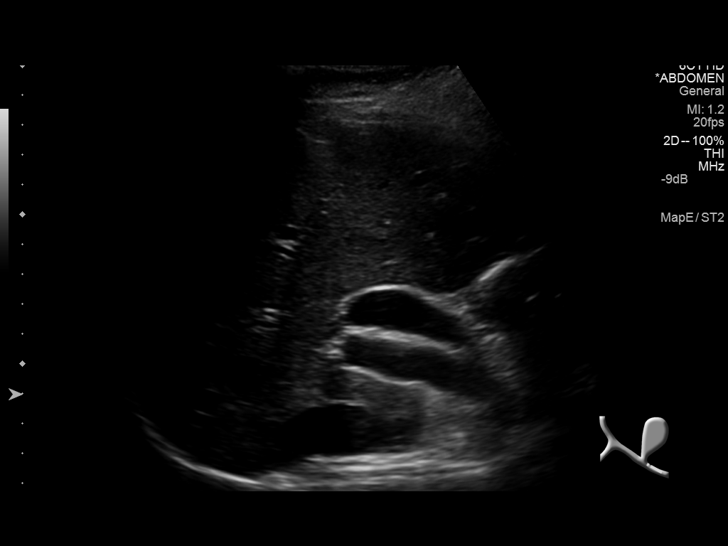
[im 31/91]
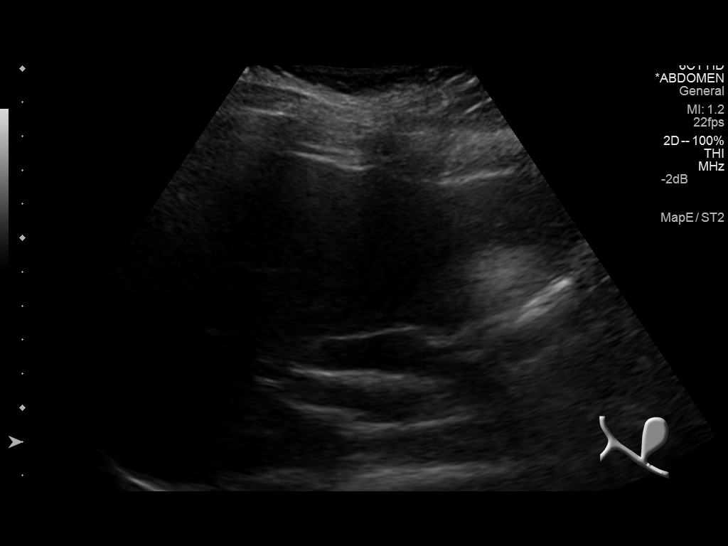
[im 34/91]
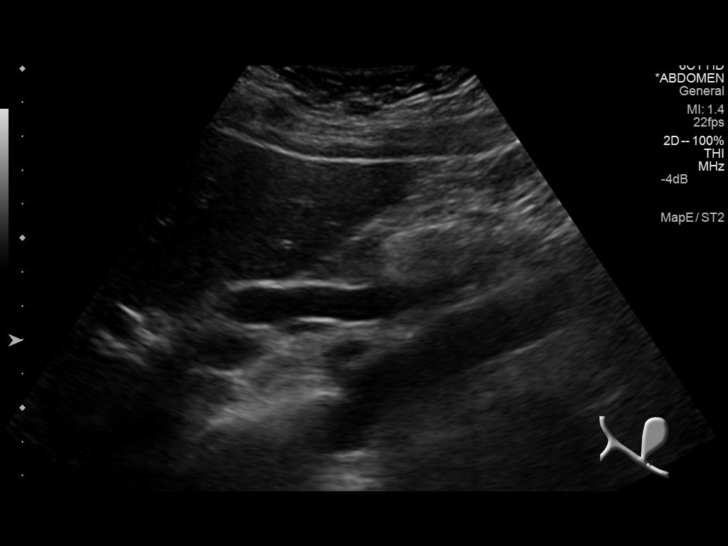
[im 42/91]
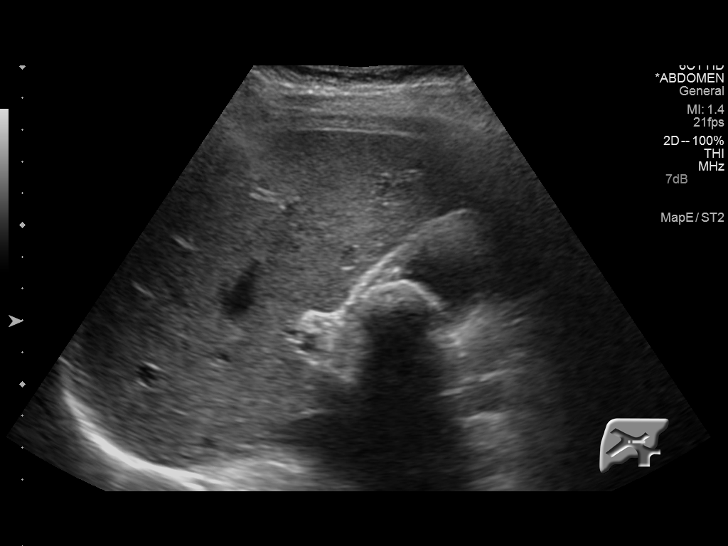
[im 49/91]
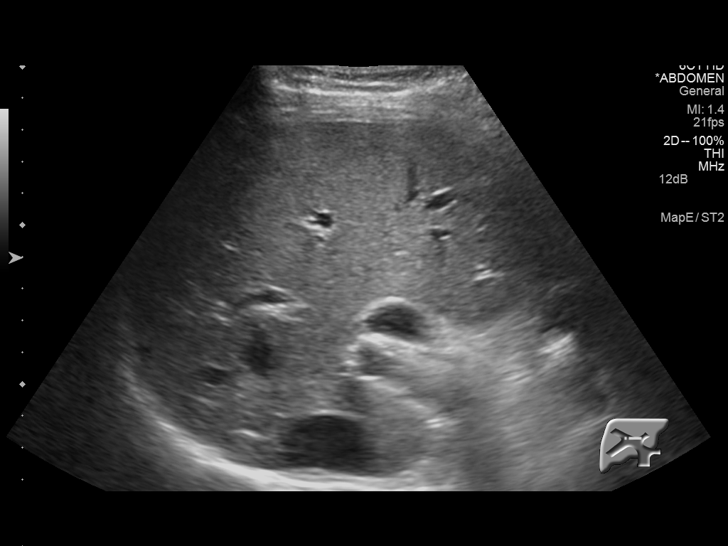
[im 57/91]
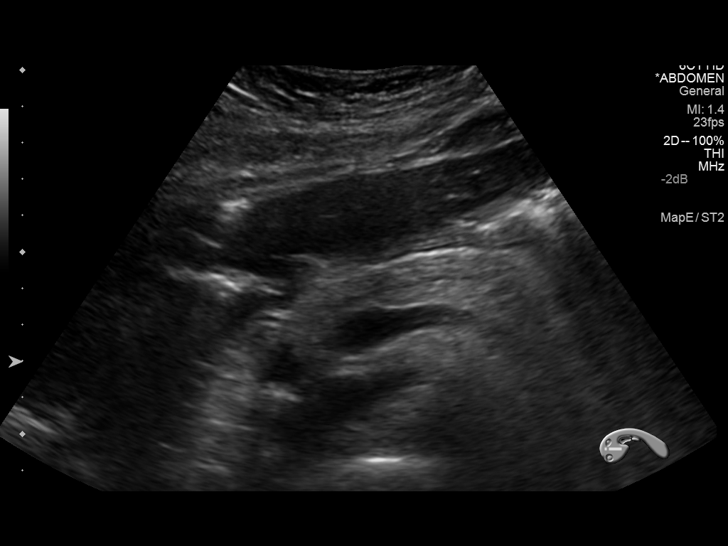
[im 61/91]
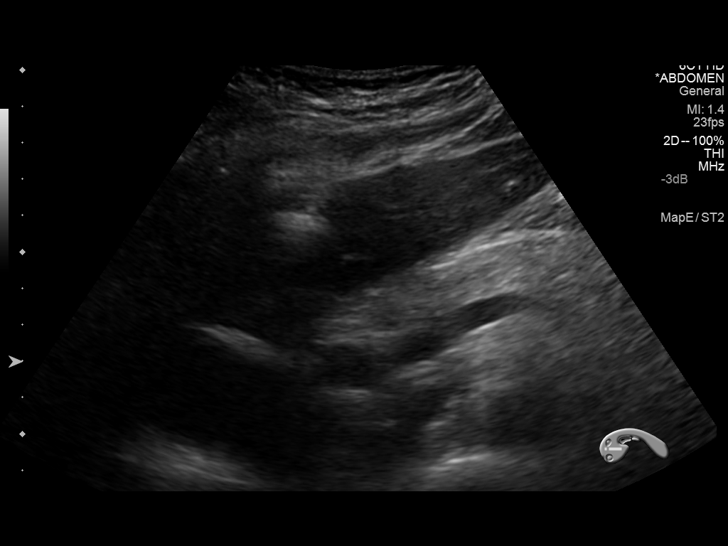
[im 68/91]
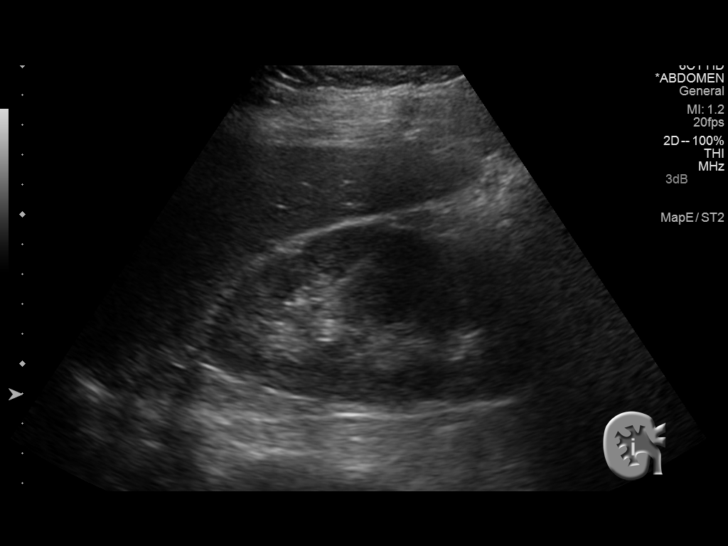
[im 76/91]
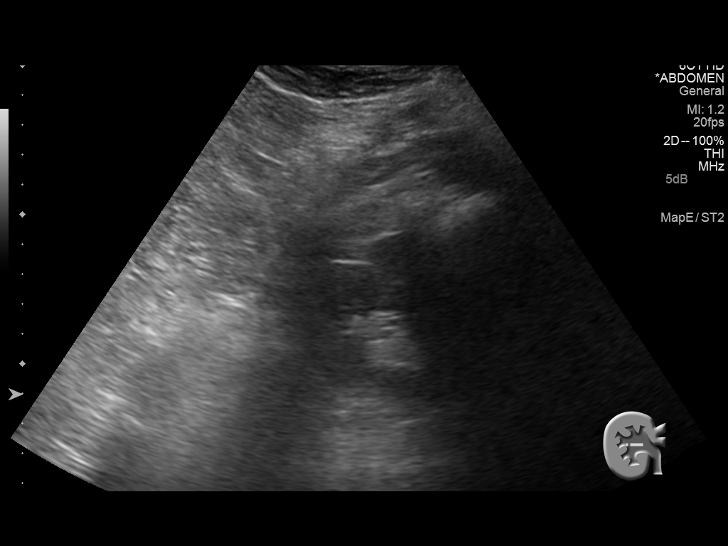
[im 83/91]
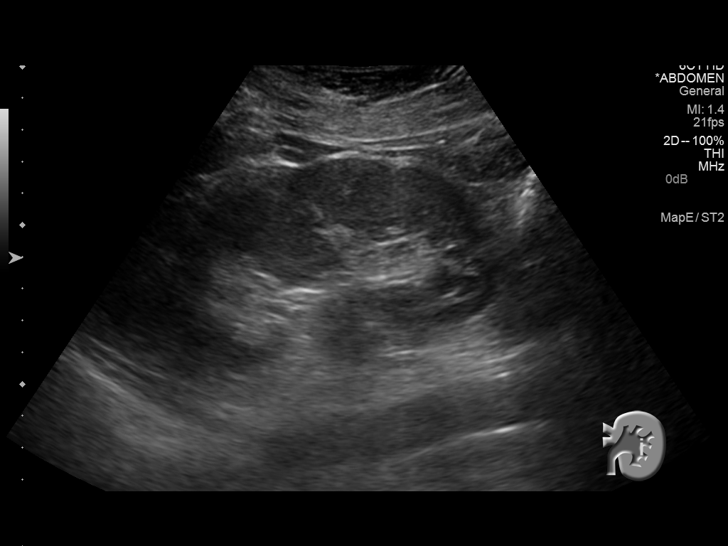
[im 91/91]
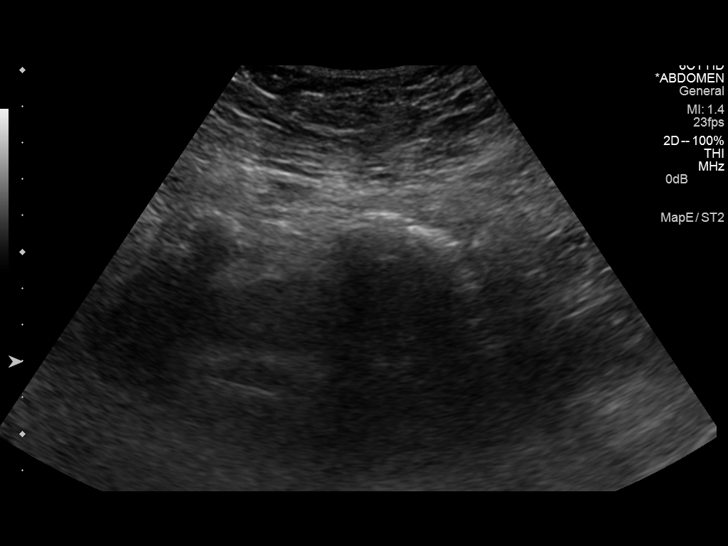

[14 of 25 positions shown; findings below may reference images not displayed]

FINDINGS: Gallbladder: Cholelithiasis with gallbladder wall thickening
measuring 6.8 mm. No pericholecystic fluid. Negative sonographic
Murphy sign.

Common bile duct: Diameter: 14.4 mm.  No choledocholithiasis.

Liver: No focal lesion identified. Within normal limits in
parenchymal echogenicity.

IVC: No abnormality visualized.

Pancreas: Visualized portion unremarkable.

Spleen: Size and appearance within normal limits.

Right Kidney: Length: 12.9 cm. Echogenicity within normal limits. No
mass or hydronephrosis visualized.

Left Kidney: Length: 11.6 cm. Echogenicity within normal limits. No
mass or hydronephrosis visualized.

Abdominal aorta: No aneurysm visualized.

Other findings: None.
IMPRESSION: 1. Cholelithiasis with gallbladder wall thickening, but negative
sonographic Murphy sign. The appearance is concerning acute
cholecystitis. Common bile duct dilatation without
choledocholithiasis. If there is further clinical concern, recommend
evaluation with MRCP.

## 2018-08-24 ENCOUNTER — Encounter: Payer: Self-pay | Admitting: Endocrinology

## 2018-08-24 ENCOUNTER — Ambulatory Visit (INDEPENDENT_AMBULATORY_CARE_PROVIDER_SITE_OTHER): Payer: 59 | Admitting: Endocrinology

## 2018-08-24 DIAGNOSIS — E042 Nontoxic multinodular goiter: Secondary | ICD-10-CM

## 2018-08-24 NOTE — Patient Instructions (Signed)
Let's recheck the ultrasound.  you will receive a phone call, about a day and time for an appointment. Based on the results, we may need to recheck the biopsy, with DNA testing.   If these are fine, please come back for a follow-up appointment in 1 year.

## 2018-08-24 NOTE — Progress Notes (Signed)
Subjective:    Patient ID: Meredith Hayes, female    DOB: Feb 20, 1956, 62 y.o.   MRN: [id number]  HPI Pt is referred by Roxy Manns, NP, for nodular thyroid.  Pt was noted to have a nodule at the thyroid in early 2019, on a routine exam.  she is unaware of ever having had thyroid problems in the past.  she has no h/o XRT or surgery to the neck.  She has moderate fatigue, and assoc dry skin.   Past Medical History:  Diagnosis Date  . ANXIETY 11/15/2008  . COPD 11/15/2008  . DEPRESSIVE DISORDER 12/12/2008  . ELEVATED BLOOD PRESSURE WITHOUT DIAGNOSIS OF HYPERTENSION 11/14/2008  . GERD 11/15/2008  . 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 SYNCOPE 10/10/2009    Past Surgical History:  Procedure Laterality Date  . CHOLECYSTECTOMY N/A 05/22/2015   Procedure: LAPAROSCOPIC CHOLECYSTECTOMY;  Surgeon: Judeth Horn, MD;  Location: [facility];  Service: General;  Laterality: N/A;  . COLONOSCOPY  12/09/11    Social History   Socioeconomic History  . Marital status: Single    Spouse name: Not on file  . Number of children: Not on file  . Years of education: Not on file  . Highest education level: Not on file  Occupational History  . Occupation: Radiation protection practitioner: [employer]  . Financial resource strain: Not on file  . Food insecurity:    Worry: Not on file    Inability: Not on file  . Transportation needs:    Medical: Not on file    Non-medical: Not on file  Tobacco Use  . Smoking status: Former Smoker    Packs/day: 1.00    Years: 35.00    Pack years: 35.00    Types: Cigarettes    Last attempt to quit: 05/01/2007    Years since quitting: 11.3  . Smokeless tobacco: Never Used  . Tobacco comment: quit smoking March 2008  Substance and Sexual Activity  . Alcohol use: Yes    Alcohol/week: 2.0 standard drinks    Types: 2 Cans of beer per week  . Drug use: No  . Sexual activity: Yes  Lifestyle  . Physical activity:    Days per week: Not on file    Minutes per  session: Not on file  . Stress: Not on file  Relationships  . Social connections:    Talks on phone: Not on file    Gets together: Not on file    Attends religious service: Not on file    Active member of club or organization: Not on file    Attends meetings of clubs or organizations: Not on file    Relationship status: Not on file  . Intimate partner violence:    Fear of current or ex partner: Not on file    Emotionally abused: Not on file    Physically abused: Not on file    Forced sexual activity: Not on file  Other Topics Concern  . Not on file  Social History Narrative  . Not on file    Current Outpatient Medications on File Prior to Visit  Medication Sig Dispense Refill  . acetaminophen (TYLENOL) 325 MG tablet Take 650 mg by mouth every 6 (six) hours as needed.      . ALPRAZolam (XANAX) 0.5 MG tablet TAKE 1 TABLET BY MOUTH EVERY DAY AS NEEDED FOR ANXIETY 30 tablet 5  . buPROPion (WELLBUTRIN XL) 150 MG 24 hr tablet TAKE 1 TABLET  EVERY MORNING X 7 DAYS THEN 2 TABLETS EVERY MORNING    . DOCUSATE SODIUM PO Take 300 mg by mouth daily.    Marland Kitchen doxycycline (VIBRAMYCIN) 100 MG capsule Take 100 mg by mouth daily.     . fluticasone (FLONASE) 50 MCG/ACT nasal spray Place 1-2 sprays into both nostrils daily as needed for allergies or rhinitis.    Marland Kitchen METRONIDAZOLE, TOPICAL, 0.75 % LOTN Apply 1 application topically 2 (two) times daily.    . Multiple Vitamins-Minerals (MULTIVITAMIN ADULTS 50+) TABS Take 1 tablet by mouth 2 (two) times daily.    . Niacinamide-Zinc-Copper-FA (TL NICOTINAMIDE PO) Take 250 mg by mouth daily.    . OMEGA-3 KRILL OIL PO Take 350 mg by mouth every other day.    Marland Kitchen OVER THE COUNTER MEDICATION Take 50 mg by mouth daily. OTC Pterostilbene    . oxyCODONE (ROXICODONE) 5 MG/5ML solution Take 15 mg by mouth every 4 (four) hours as needed for severe pain.    . Probiotic Product (PROBIOTIC PO) Take 1 capsule by mouth daily. OTC Senior Probiotic 6 Billion CFU    . Sennosides  17.2 MG TABS Take 17.2 mg by mouth daily.    Marland Kitchen tolterodine (DETROL LA) 4 MG 24 hr capsule Take 4 mg by mouth daily.    . ursodiol (ACTIGALL) 300 MG capsule Take 300 mg by mouth 2 (two) times daily.    . calcium carbonate (TUMS - DOSED IN MG ELEMENTAL CALCIUM) 500 MG chewable tablet Chew by mouth.    . escitalopram (LEXAPRO) 20 MG tablet Take by mouth.    . oxybutynin (DITROPAN XL) 15 MG 24 hr tablet Take by mouth.     No current facility-administered medications on file prior to visit.     Allergies  Allergen Reactions  . Penicillins Anaphylaxis  . Dexamethasone Sodium Phosphate Rash  . Ciprofloxacin Hcl Other (See Comments)    IV Cipro -- Redness and burning  . Codeine Nausea And Vomiting  . Influenza Vaccine Live     Red, swollen sight  . Other Hives    IV cipro     Family History  Problem Relation Age of Onset  . Arthritis Other   . Hypertension Other   . Kidney cancer Father   . Kidney cancer Sister   . Colon cancer Paternal Grandmother   . Thyroid disease Mother        thyroidect--benign  . Kidney cancer Paternal Grandfather     BP (!) 150/90 (BP Location: Right Arm, Patient Position: Sitting, Cuff Size: Normal)   Pulse 95   Ht 5\' 4"  (1.626 m)   Wt 229 lb (103.9 kg)   SpO2 96%   BMI 39.31 kg/m    Review of Systems Denies hoarseness, neck pain, visual loss, chest pain, sob, cough, dysphagia, diarrhea, itching, cold intolerance, headache, numbness, and rhinorrhea. She has lost weight, due to her efforts.  She has intermitt flushing and easy bruising.  Depression is well-controlled     Objective:   Physical Exam VS: see vs page GEN: no distress HEAD: head: no deformity eyes: no periorbital swelling, no proptosis external nose and ears are normal.  mouth: no lesion seen.   NECK: the 3 thyroid nodules are palpable.   CHEST WALL: no deformity LUNGS: clear to auscultation CV: reg rate and rhythm, no murmur ABD: abdomen is soft, nontender.  no  hepatosplenomegaly.  not distended.  no hernia MUSCULOSKELETAL: muscle bulk and strength are grossly normal.  no obvious joint swelling.  gait is normal and steady EXTEMITIES: no deformity.  no ulcer on the feet.  feet are of normal color and temp.  no edema PULSES: dorsalis pedis intact bilat.  no carotid bruit NEURO:  cn 2-12 grossly intact.   readily moves all 4's.  sensation is intact to touch on the feet SKIN:  Normal texture and temperature.  No rash or suspicious lesion is visible.   NODES:  None palpable at the neck PSYCH: alert, well-oriented.  Does not appear anxious nor depressed.   [place]: Right lobe is enlarged and heterogeneous measuring  6.3 x 3 x 3.4 cm. Left lobe measures 4.8 x 2.8 x 2.6 cm. The isthmus measures 0.5 cm in AP diameter. The gland is heterogeneous in echotexture and relatively replaced by multiple nodules. For index purposes, the following nodules are measured. 1.8 x 0.8 x 1.6 cm hypoechoic solid right isthmic nodule. 2.3 x 1.4 x 1.8 cm hyperechoic solid left upper pole nodule. 1.6 x 1.3 x 1.5 cm hyperechoic solid left lower pole nodule.    outside test results are reviewed: TSH=0.7   right isthmus hypoechoic nodule (smears, ThinPrep); Follicular Lesion of Unknown Significance (FLUS). Category III  I have reviewed outside records, and summarized: Pt was noted to have nodular goiter, and referred here.  Other probs addressed were anxiety, rosacea, and wellness.      Assessment & Plan:  Multinodular goiter, new to me.  1 nodule had a class 3 bx result. She is due for recheck of [place]  Patient Instructions  Let's recheck the ultrasound.  you will receive a phone call, about a day and time for an appointment. Based on the results, we may need to recheck the biopsy, with DNA testing.   If these are fine, please come back for a follow-up appointment in 1 year.

## 2018-08-27 DIAGNOSIS — E042 Nontoxic multinodular goiter: Secondary | ICD-10-CM | POA: Insufficient documentation

## 2018-12-19 ENCOUNTER — Telehealth: Payer: Self-pay | Admitting: Endocrinology

## 2018-12-19 DIAGNOSIS — E042 Nontoxic multinodular goiter: Secondary | ICD-10-CM

## 2018-12-19 NOTE — Telephone Encounter (Signed)
I do not see any orders previously placed, please advise

## 2018-12-19 NOTE — Telephone Encounter (Signed)
I spoke with the patient today and she was very upset-she reported that the [place] of thyroid order should have been placed back in August when she came in to see Dr. Loanne Drilling for her new patient appointment. I was able to see in his notes that he was going to order this but this did not get ordered until today- patient stated she has called the office 3 times about this matter and no one has called her back- I did not see any other phone calls in her chart for this- patient states she would like to see another Endocrinologist in our office because she feels this was not good patient care and she would like to see Dr. Kelton Pillar- I have sent the referral over to GI so the patient should be called about this by next week but the patient still wanted to talk to the office manager about this matter as soon as possible- patient was told she has thyroid cancer and is afraid this has been put off too long

## 2018-12-19 NOTE — Telephone Encounter (Signed)
I placed the order.  you will receive a phone call, about a day and time for an appointment.

## 2018-12-19 NOTE — Telephone Encounter (Signed)
Patient has called upset with our office stating she was supposed to have an ultrasound with [place] and she has not been called but she has called [place] 3 times to schedule the appointment. I stated to the patient the referral would have been sent to the office who would do the ultrasound and they would be contacting her to schedule, also stated we do not have record of any calls. Please Advise with patient phone number of the office she needs to be contacting for the ultrasound. Thanks

## 2018-12-21 NOTE — Telephone Encounter (Signed)
Got patient scheduled with GI to have US done on 12/26/18- called patient to inform her of this appointment and gave her all of the instructions needed- patient was satisfied with this

## 2018-12-26 ENCOUNTER — Ambulatory Visit
Admission: RE | Admit: 2018-12-26 | Discharge: 2018-12-26 | Disposition: A | Discharge status: Home / Self Care | Payer: 59 | Source: Ambulatory Visit | Attending: Endocrinology | Admitting: Endocrinology

## 2018-12-26 DIAGNOSIS — E042 Nontoxic multinodular goiter: Secondary | ICD-10-CM

## 2019-08-23 ENCOUNTER — Other Ambulatory Visit: Payer: Self-pay

## 2019-08-28 ENCOUNTER — Other Ambulatory Visit: Payer: Self-pay

## 2019-08-28 ENCOUNTER — Ambulatory Visit (INDEPENDENT_AMBULATORY_CARE_PROVIDER_SITE_OTHER): Payer: 59 | Admitting: Endocrinology

## 2019-08-28 ENCOUNTER — Encounter: Payer: Self-pay | Admitting: Endocrinology

## 2019-08-28 VITALS — BP 148/88 | HR 88 | Ht 64.0 in | Wt 248.2 lb

## 2019-08-28 DIAGNOSIS — E042 Nontoxic multinodular goiter: Secondary | ICD-10-CM | POA: Diagnosis not present

## 2019-08-28 LAB — TSH: TSH: 0.72 u[IU]/mL (ref 0.35–4.50)

## 2019-08-28 LAB — T4, FREE: Free T4: 0.7 ng/dL (ref 0.60–1.60)

## 2019-08-28 NOTE — Patient Instructions (Addendum)
Your blood pressure is high today.  Please see your primary care provider soon, to have it rechecked Blood tests are requested for you today.  We'll let you know about the results.  Let's recheck the ultrasound.  you will receive a phone call, about a day and time for an appointment. Please come back for a follow-up appointment in 1 year. 

## 2019-08-28 NOTE — Progress Notes (Signed)
Subjective:    Patient ID: Meredith Hayes, female    DOB: [birth year], 63 y.o.   MRN: [id number]  HPI  Pt returns for f/u of MNG (dx'ed 2019, on a routine exam; bx in 2019 showed (FLUS), cat 3; she chose surveillance over repeat bx; she is euthyroid off rx).  She does not notice the goiter.  Denies neck swelling.   Past Medical History:  Diagnosis Date  . ANXIETY 11/15/2008  . COPD 11/15/2008  . DEPRESSIVE DISORDER 12/12/2008  . ELEVATED BLOOD PRESSURE WITHOUT DIAGNOSIS OF HYPERTENSION 11/14/2008  . GERD 11/15/2008  . 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 SYNCOPE 10/10/2009    Past Surgical History:  Procedure Laterality Date  . CHOLECYSTECTOMY N/A 05/22/2015   Procedure: LAPAROSCOPIC CHOLECYSTECTOMY;  Surgeon: Judeth Horn, MD;  Location: [facility];  Service: General;  Laterality: N/A;  . COLONOSCOPY  12/09/11    Social History   Socioeconomic History  . Marital status: Widowed    Spouse name: Not on file  . Number of children: Not on file  . Years of education: Not on file  . Highest education level: Not on file  Occupational History  . Occupation: Radiation protection practitioner: [employer]  . Financial resource strain: Not on file  . Food insecurity    Worry: Not on file    Inability: Not on file  . Transportation needs    Medical: Not on file    Non-medical: Not on file  Tobacco Use  . Smoking status: Former Smoker    Packs/day: 1.00    Years: 35.00    Pack years: 35.00    Types: Cigarettes    Quit date: 05/01/2007    Years since quitting: 12.3  . Smokeless tobacco: Never Used  . Tobacco comment: quit smoking March 2008  Substance and Sexual Activity  . Alcohol use: Yes    Alcohol/week: 2.0 standard drinks    Types: 2 Cans of beer per week  . Drug use: No  . Sexual activity: Yes  Lifestyle  . Physical activity    Days per week: Not on file    Minutes per session: Not on file  . Stress: Not on file  Relationships  . Social Herbalist on  phone: Not on file    Gets together: Not on file    Attends religious service: Not on file    Active member of club or organization: Not on file    Attends meetings of clubs or organizations: Not on file    Relationship status: Not on file  . Intimate partner violence    Fear of current or ex partner: Not on file    Emotionally abused: Not on file    Physically abused: Not on file    Forced sexual activity: Not on file  Other Topics Concern  . Not on file  Social History Narrative  . Not on file    Current Outpatient Medications on File Prior to Visit  Medication Sig Dispense Refill  . acetaminophen (TYLENOL) 325 MG tablet Take 650 mg by mouth every 6 (six) hours as needed.      . ALPRAZolam (XANAX) 0.5 MG tablet TAKE 1 TABLET BY MOUTH EVERY DAY AS NEEDED FOR ANXIETY 30 tablet 5  . calcium carbonate (TUMS - DOSED IN MG ELEMENTAL CALCIUM) 500 MG chewable tablet Chew by mouth.    . doxycycline (VIBRAMYCIN) 100 MG capsule Take 100 mg by mouth daily.     Marland Kitchen  DULoxetine (CYMBALTA) 20 MG capsule Take 20 mg by mouth daily.    . fluticasone (FLONASE) 50 MCG/ACT nasal spray Place 1-2 sprays into both nostrils daily as needed for allergies or rhinitis.    Marland Kitchen METRONIDAZOLE, TOPICAL, 0.75 % LOTN Apply 1 application topically 2 (two) times daily.    . Multiple Vitamins-Minerals (MULTIVITAMIN ADULTS 50+) TABS Take 1 tablet by mouth 2 (two) times daily.    . Niacinamide-Zinc-Copper-FA (TL NICOTINAMIDE PO) Take 250 mg by mouth daily.    . OMEGA-3 KRILL OIL PO Take 350 mg by mouth every other day.    Marland Kitchen OVER THE COUNTER MEDICATION Take 50 mg by mouth daily. OTC Pterostilbene    . Probiotic Product (PROBIOTIC PO) Take 1 capsule by mouth daily. OTC Senior Probiotic 6 Billion CFU    . Sennosides 17.2 MG TABS Take 17.2 mg by mouth daily.     No current facility-administered medications on file prior to visit.     Allergies  Allergen Reactions  . Penicillins Anaphylaxis  . Dexamethasone Sodium Phosphate  Rash  . Ciprofloxacin Hcl Other (See Comments)    IV Cipro -- Redness and burning  . Codeine Nausea And Vomiting  . Influenza Vaccine Live     Red, swollen sight  . Other Hives    IV cipro     Family History  Problem Relation Age of Onset  . Arthritis Other   . Hypertension Other   . Kidney cancer Father   . Kidney cancer Sister   . Colon cancer Paternal Grandmother   . Thyroid disease Mother        thyroidect--benign  . Kidney cancer Paternal Grandfather     BP (!) 148/88 (BP Location: Right Arm, Patient Position: Sitting, Cuff Size: Large)   Pulse 88   Ht 5\' 4"  (1.626 m)   Wt 248 lb 3.2 oz (112.6 kg)   SpO2 93%   BMI 42.60 kg/m   Review of Systems Denies neck pain    Objective:   Physical Exam VITAL SIGNS:  See vs page GENERAL: no distress NECK: thyroid is twice normal size.  Multiple nodules are again palpated.      Assessment & Plan:  HTN: is noted today MNG: due for recheck   Patient Instructions  Your blood pressure is high today.  Please see your primary care provider soon, to have it rechecked Blood tests are requested for you today.  We'll let you know about the results.  Let's recheck the ultrasound.  you will receive a phone call, about a day and time for an appointment. Please come back for a follow-up appointment in 1 year.

## 2019-09-19 ENCOUNTER — Ambulatory Visit
Admission: RE | Admit: 2019-09-19 | Discharge: 2019-09-19 | Disposition: A | Discharge status: Home / Self Care | Payer: 59 | Source: Ambulatory Visit | Attending: Endocrinology | Admitting: Endocrinology

## 2019-09-19 DIAGNOSIS — E042 Nontoxic multinodular goiter: Secondary | ICD-10-CM

## 2020-02-13 IMAGING — US US THYROID
1 series · 12 of 25 positions shown · non-contrast
Comparison: Prior thyroid ultrasound 12/26/2018

CLINICAL DATA: Goiter.

EXAM:
THYROID ULTRASOUND
TECHNIQUE: Ultrasound examination of the thyroid gland and adjacent soft
tissues was performed.

[Series 1: us thyroid · 0.06mm/px · 12 of 70 slices shown]
[im 3/70]
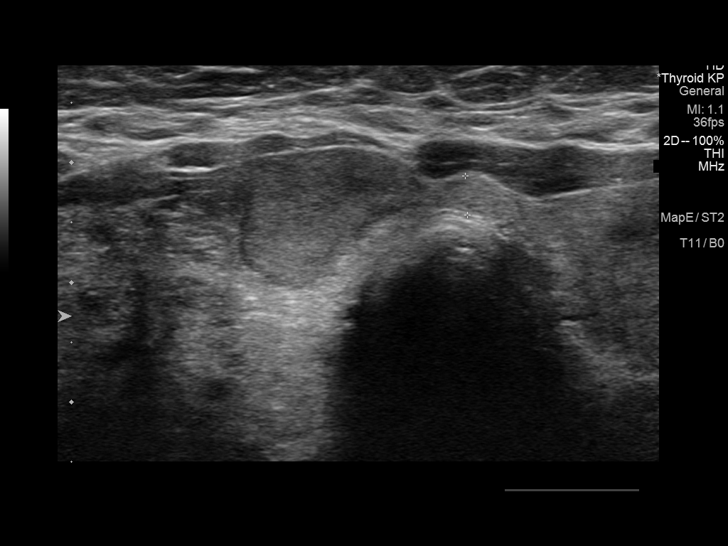
[im 9/70]
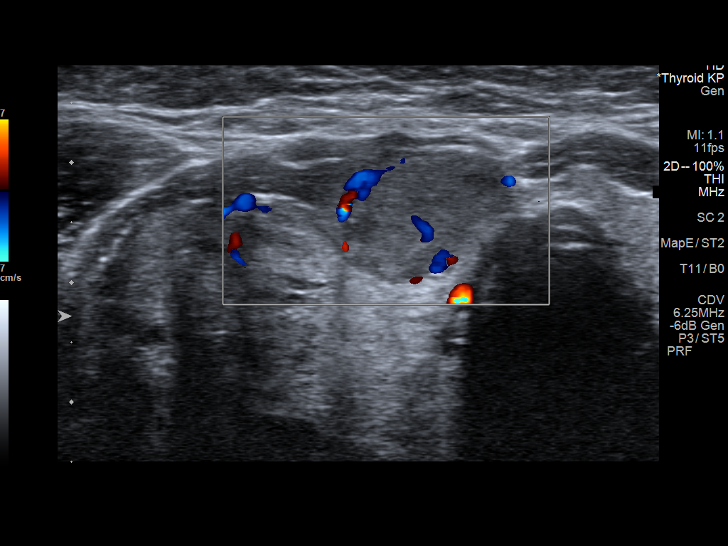
[im 15/70]
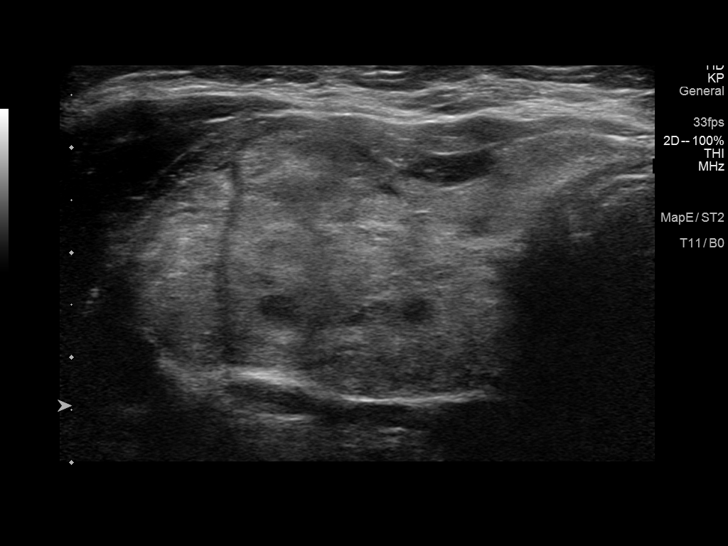
[im 21/70]
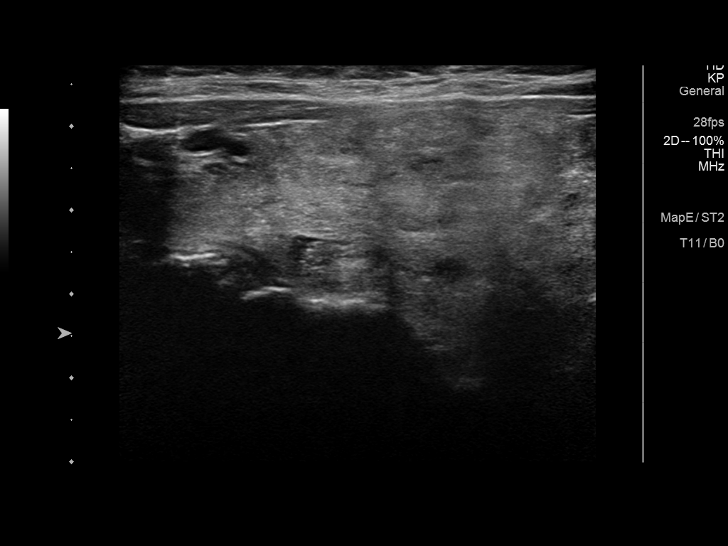
[im 26/70]
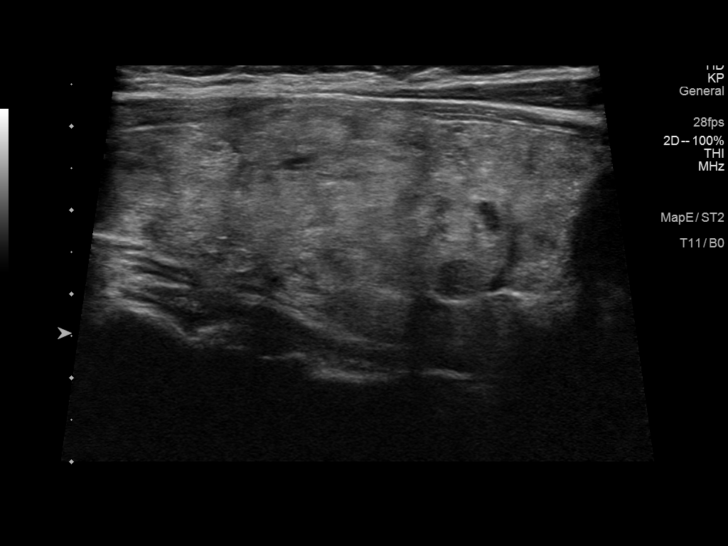
[im 32/70]
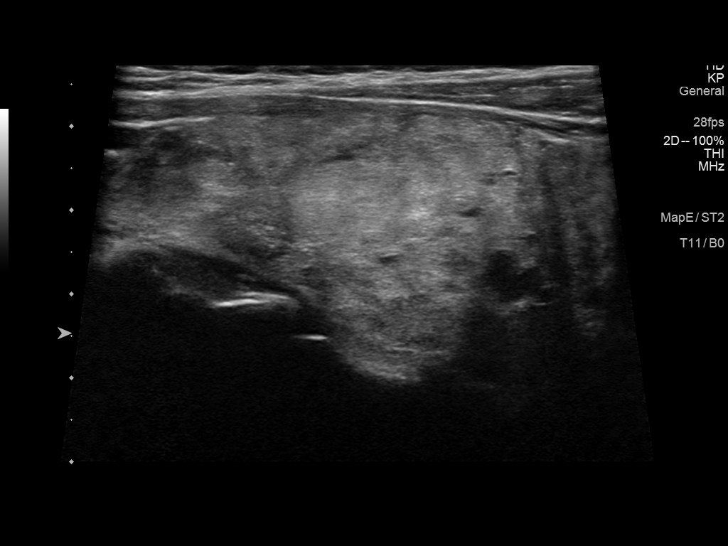
[im 38/70]
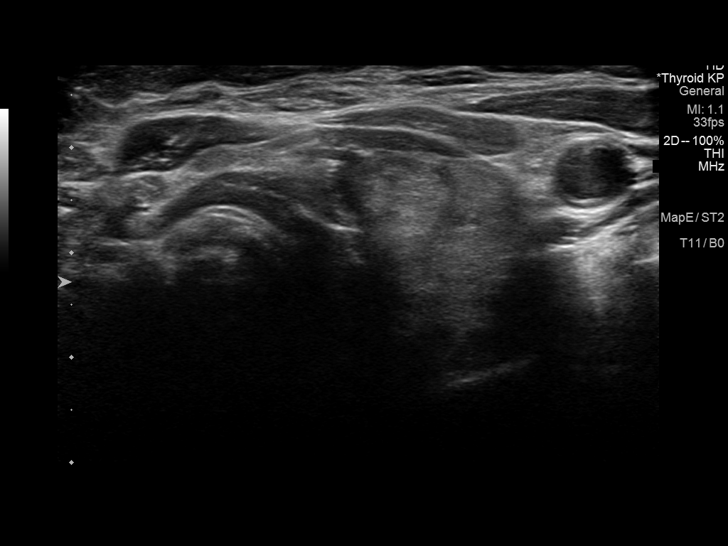
[im 44/70]
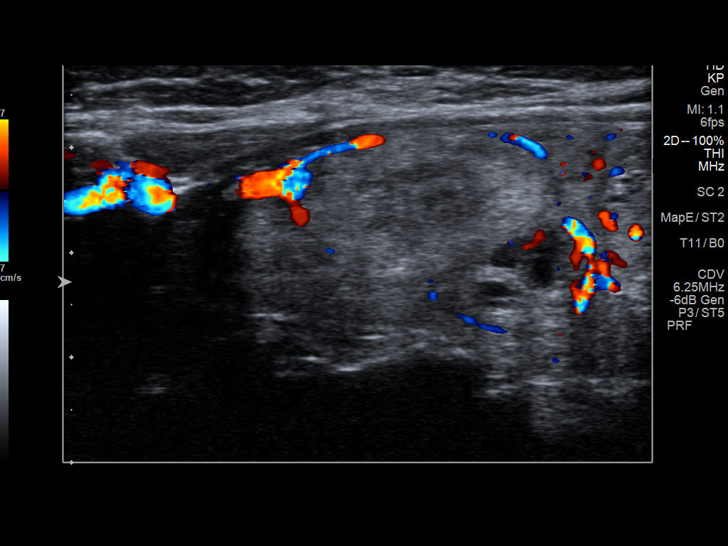
[im 49/70]
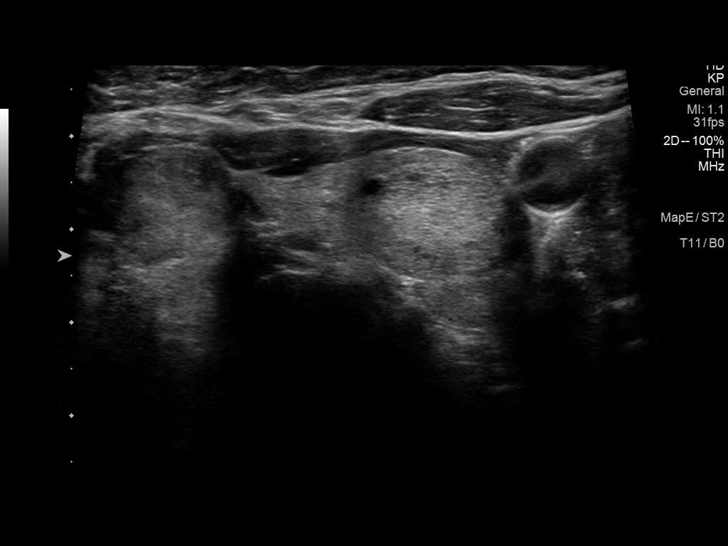
[im 55/70]
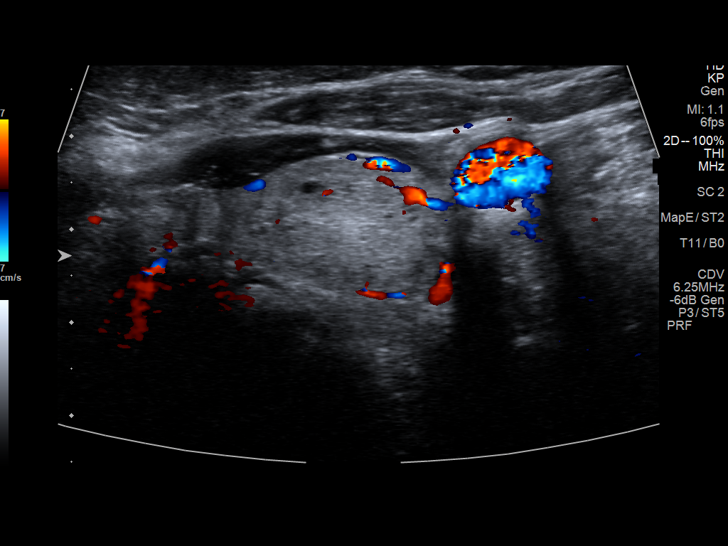
[im 61/70]
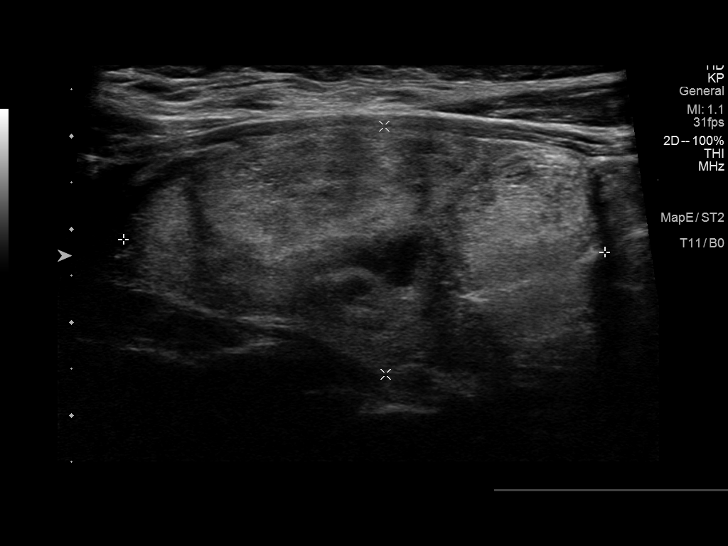
[im 67/70]
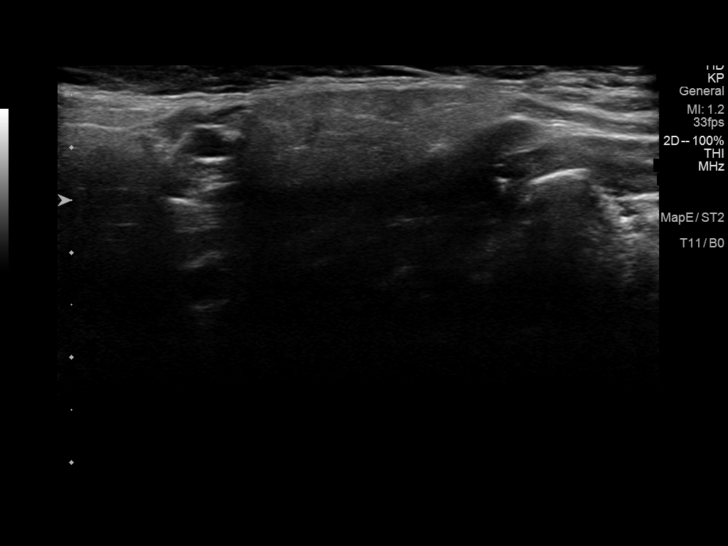

[12 of 25 positions shown; findings below may reference images not displayed]

FINDINGS: Parenchymal Echotexture: Markedly heterogenous

Isthmus: 0.3 cm

Right lobe: 7.0 x 2.9 x 4.1 cm

Left lobe: 5.2 x 2.7 x 2.6 cm

_________________________________________________________

Estimated total number of nodules >/= 1 cm: 3

Number of spongiform nodules >/=  2 cm not described below (TR1): 0

Number of mixed cystic and solid nodules >/= 1.5 cm not described
below (TR2): 0

_________________________________________________________

Nodule # 1:

Prior biopsy: No

Location: Isthmus; Inferior

Maximum size: 1.8 cm; Other 2 dimensions: 1.5 x 1.1 cm, previously,
1.9 x 1.5 x 1.0 cm

Composition: solid/almost completely solid (2)

Echogenicity: hypoechoic (2)

Shape: not taller-than-wide (0)

Margins: smooth (0)

Echogenic foci: none (0)

ACR TI-RADS total points: 4.

ACR TI-RADS risk category:  TR4 (4-6 points).

Significant change in size (>/= 20% in two dimensions and minimal
increase of 2 mm): No

Change in features: No

Change in ACR TI-RADS risk category: No

ACR TI-RADS recommendations:

**Given size (>/= 1.5 cm) and appearance, fine needle aspiration of
this moderately suspicious nodule should be considered based on
TI-RADS criteria.

_________________________________________________________

Nodule # 2:

Prior biopsy: No

Location: Left; Superior

Maximum size: 2.3 cm; Other 2 dimensions: 2.0 x 1.3 cm, previously,
2.4 x 1.9 x 1.5 cm

Composition: solid/almost completely solid (2)

Echogenicity: hyperechoic (1)

Shape: not taller-than-wide (0)

Margins: ill-defined (0)

Echogenic foci: none (0)

ACR TI-RADS total points: 3.

ACR TI-RADS risk category:  TR3 (3 points).

Significant change in size (>/= 20% in two dimensions and minimal
increase of 2 mm): No

Change in features: No

Change in ACR TI-RADS risk category: No

ACR TI-RADS recommendations:

*Given size (>/= 1.5 - 2.4 cm) and appearance, a follow-up
ultrasound in 1 year should be considered based on TI-RADS criteria.

_________________________________________________________

Nodule # 3:

Prior biopsy: No

Location: Left; Inferior

Maximum size: 1.8 cm; Other 2 dimensions: 1.7 x 1.3 cm, previously,
1.6 x 1.5 x 1.3 cm

Composition: solid/almost completely solid (2)

Echogenicity: hyperechoic (1)

Shape: not taller-than-wide (0)

Margins: ill-defined (0)

Echogenic foci: none (0)

ACR TI-RADS total points: 3.

ACR TI-RADS risk category:  TR3 (3 points).

Significant change in size (>/= 20% in two dimensions and minimal
increase of 2 mm): Yes

Change in features: No

Change in ACR TI-RADS risk category: No

ACR TI-RADS recommendations:

*Given size (>/= 1.5 - 2.4 cm) and appearance, a follow-up
ultrasound in 1 year should be considered based on TI-RADS criteria.

_________________________________________________________

Additional nodules measured on the prior study now blend in with the
diffusely heterogeneous background parenchyma and are not
identifiable is discrete nodules.
IMPRESSION: 1. Stable appearance of 1.8 cm TI-RADS category 4 nodule in the
inferior aspect of the thyroid isthmus, right of midline. This
nodule continues to meet criteria for consideration of fine-needle
aspiration biopsy.
2. Additional TI-RADS category 3 nodules again noted which meet
criteria for annual surveillance.
3. Diffusely enlarged, heterogeneous and lobular thyroid gland
consistent with diffuse multinodular goiter.

The above is in keeping with the ACR TI-RADS recommendations - [HOSPITAL] 1499;[DATE].

## 2020-06-19 IMAGING — US US THYROID
1 series · 12 of 25 positions shown · non-contrast
Comparison: None.

CLINICAL DATA: 62-year-old female with a history of multinodular
thyroid

EXAM:
THYROID ULTRASOUND
TECHNIQUE: Ultrasound examination of the thyroid gland and adjacent soft
tissues was performed.

[Series 1: us thyroid · 0.05mm/px · 12 of 57 slices shown]
[im 3/57]
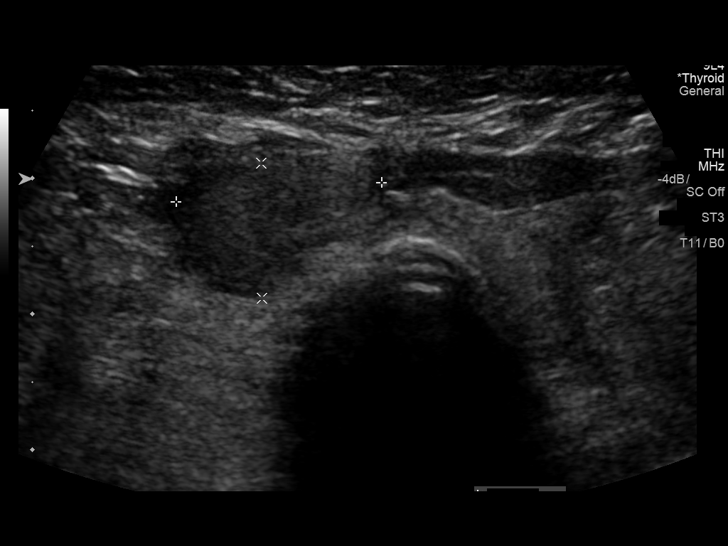
[im 8/57]
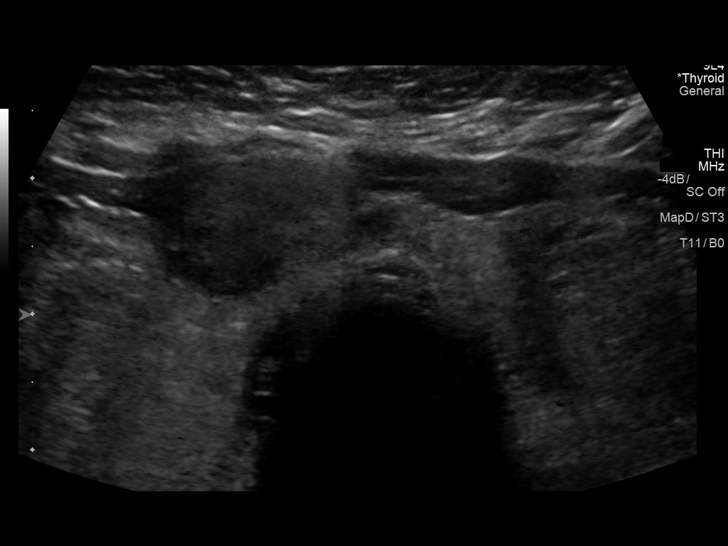
[im 12/57]
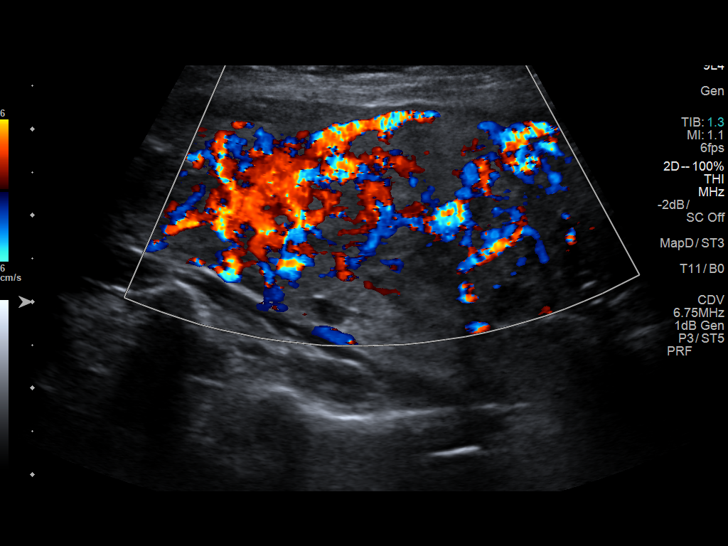
[im 17/57]
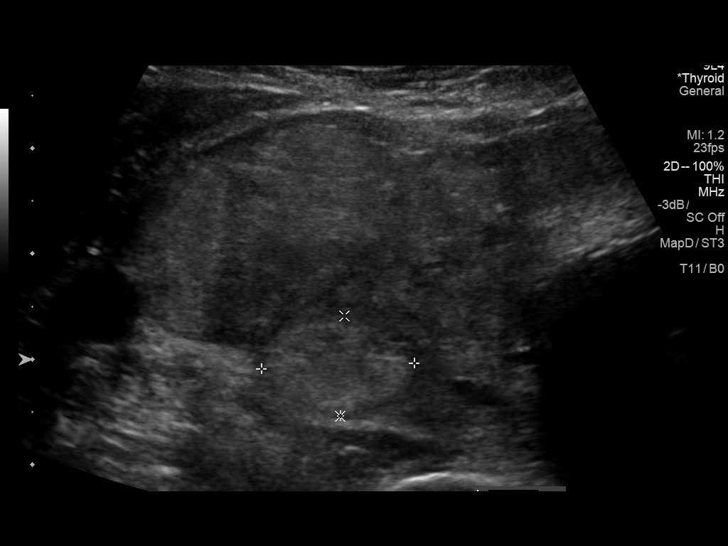
[im 22/57]
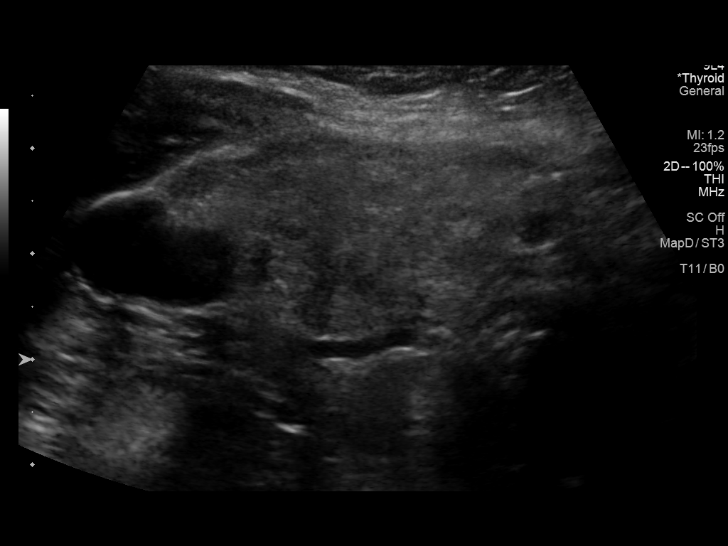
[im 26/57]
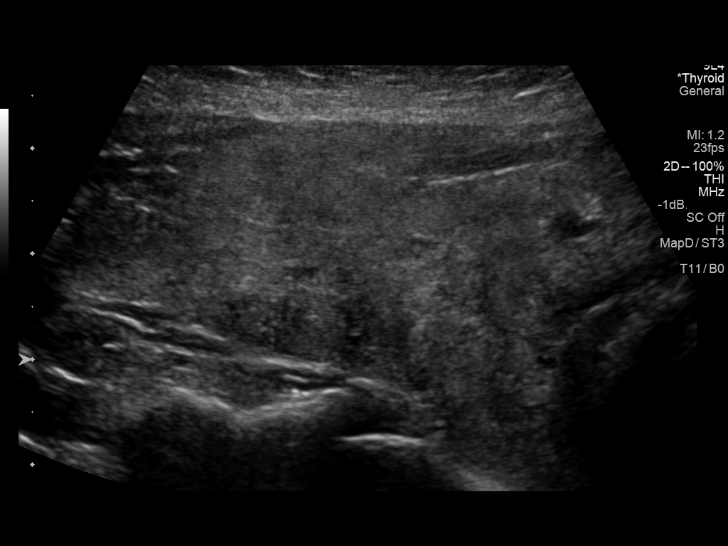
[im 31/57]
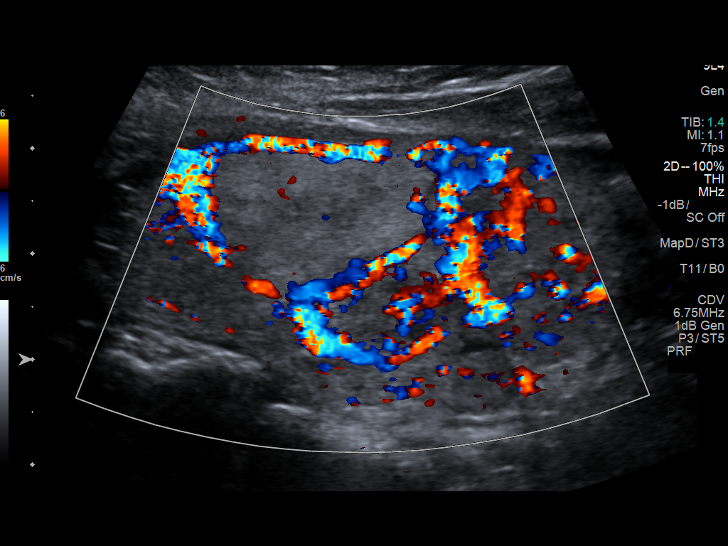
[im 36/57]
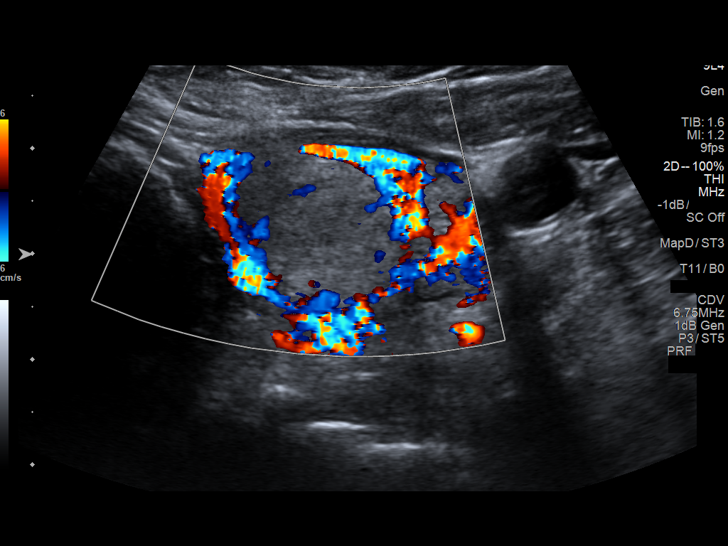
[im 40/57]
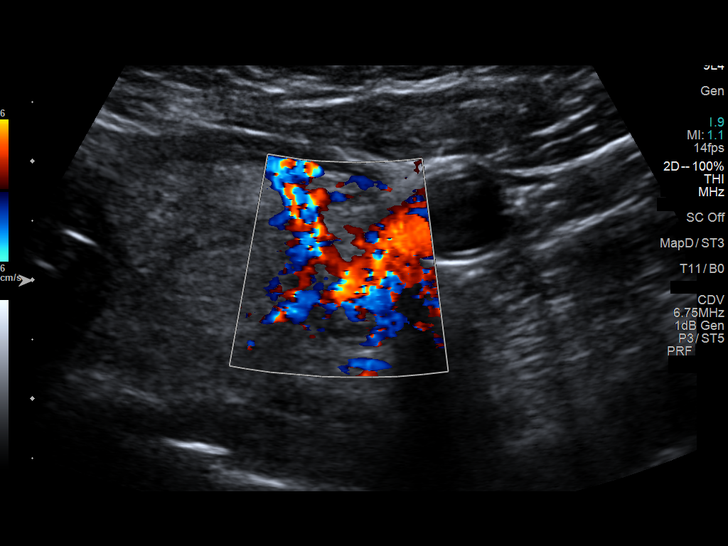
[im 45/57]
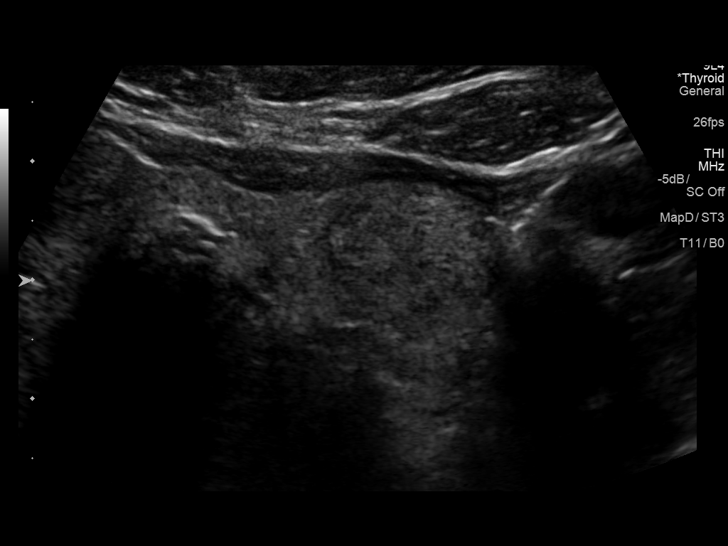
[im 50/57]
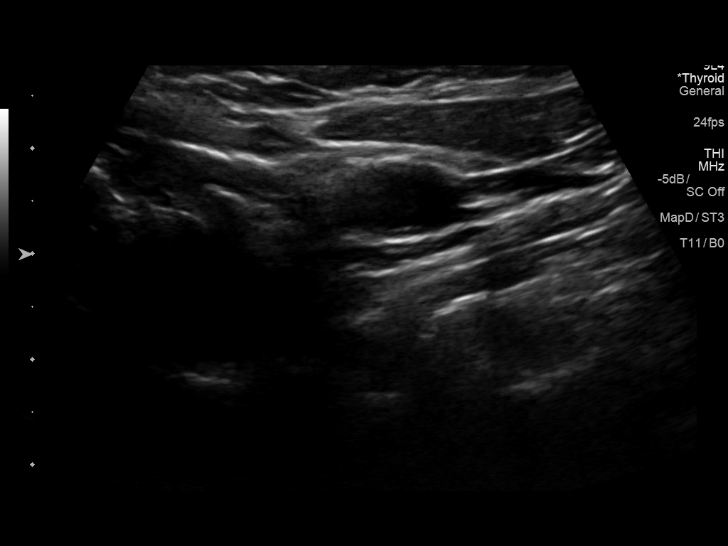
[im 54/57]
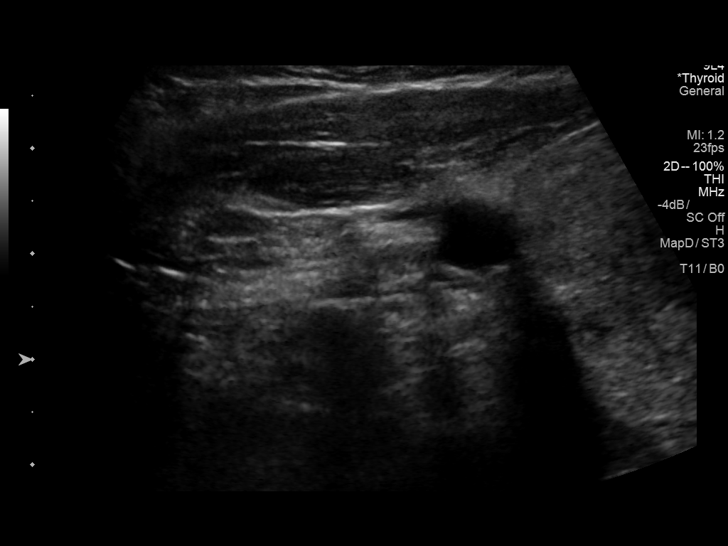

[12 of 25 positions shown; findings below may reference images not displayed]

FINDINGS: Parenchymal Echotexture: Moderately heterogenous

Isthmus: 0.4 cm

Right lobe: 6.7 cm x 3.4 cm x 4.2 cm

Left lobe: 5.5 cm x 2.7 cm x 2.7 cm

_________________________________________________________

Estimated total number of nodules >/= 1 cm: 5

Number of spongiform nodules >/=  2 cm not described below (TR1): 0

Number of mixed cystic and solid nodules >/= 1.5 cm not described
below (TR2): 0

_________________________________________________________

Nodule # 1:

Location: Isthmus; right

Maximum size: 1.9 cm; Other 2 dimensions: 1.5 cm x 1.0 cm

Composition: solid/almost completely solid (2)

Echogenicity: hypoechoic (2)

Shape: not taller-than-wide (0)

Margins: ill-defined (0)

Echogenic foci: none (0)

ACR TI-RADS total points: 4.

ACR TI-RADS risk category: TR4 (4-6 points).

ACR TI-RADS recommendations:

Nodule meets criteria for biopsy

_________________________________________________________

Nodule # 2:

Location: Right; Mid

Maximum size: 2.0 cm; Other 2 dimensions: 1.5 cm x 1.0 cm

Composition: solid/almost completely solid (2)

Echogenicity: hyperechoic (1)

Shape: not taller-than-wide (0)

Margins: ill-defined (0)

Echogenic foci: none (0)

ACR TI-RADS total points: 3.

ACR TI-RADS risk category: TR3 (3 points).

ACR TI-RADS recommendations:

Nodule meets criteria for surveillance

_________________________________________________________

Nodule # 3:

Location: Left; Superior

Maximum size: 2.4 cm; Other 2 dimensions: 1.9 cm x 1.5 cm

Composition: solid/almost completely solid (2)

Echogenicity: hyperechoic (1)

Shape: not taller-than-wide (0)

Margins: ill-defined (0)

Echogenic foci: none (0)

ACR TI-RADS total points: 3.

ACR TI-RADS risk category: TR3 (3 points).

ACR TI-RADS recommendations:

Nodule meets criteria for surveillance

_________________________________________________________

Nodule # 4:

Location: Left; Mid

Maximum size: 1.2 cm; Other 2 dimensions: 1.2 cm x 0.8 cm

Composition: cannot determine (2)

Echogenicity: isoechoic (1)

Shape: taller-than-wide (3)

Margins: ill-defined (0)

Echogenic foci: none (0)

ACR TI-RADS total points: 6.

ACR TI-RADS risk category: TR4 (4-6 points).

ACR TI-RADS recommendations:

Nodule meets criteria for surveillance

_________________________________________________________

Nodule # 5:

Location: Left; Inferior

Maximum size: 1.6 cm; Other 2 dimensions: 1.5 cm x 1.3 cm

Composition: solid/almost completely solid (2)

Echogenicity: isoechoic (1)

Shape: taller-than-wide (3)

Margins: ill-defined (0)

Echogenic foci: none (0)

ACR TI-RADS total points: 6.

ACR TI-RADS risk category: TR4 (4-6 points).

ACR TI-RADS recommendations:

Nodule meets criteria for surveillance

_________________________________________________________

No adenopathy
IMPRESSION: Enlarged multinodular thyroid.

Isthmic thyroid nodule (labeled 1) meets criteria for biopsy, as
designated by the newly established ACR TI-RADS criteria, and
referral for biopsy is recommended.

The other thyroid nodules (labeled 2-5) all meet criteria for
surveillance, as designated by the newly established ACR TI-RADS
criteria. Surveillance ultrasound study recommended to be performed
annually up to 5 years.

Recommendations follow those established by the new ACR TI-RADS
criteria ([HOSPITAL] 6909;[DATE]).

## 2020-08-27 ENCOUNTER — Ambulatory Visit: Payer: 59 | Admitting: Endocrinology

## 2020-10-06 ENCOUNTER — Other Ambulatory Visit: Payer: Self-pay

## 2020-10-06 ENCOUNTER — Ambulatory Visit (INDEPENDENT_AMBULATORY_CARE_PROVIDER_SITE_OTHER): Payer: 59 | Admitting: Endocrinology

## 2020-10-06 VITALS — BP 130/80 | HR 89 | Ht 64.5 in | Wt 228.4 lb

## 2020-10-06 DIAGNOSIS — E042 Nontoxic multinodular goiter: Secondary | ICD-10-CM | POA: Diagnosis not present

## 2020-10-06 NOTE — Patient Instructions (Addendum)
when you have your routine blood tests next week, please make sure the the TSH is included.   Let's recheck the ultrasound.  you will receive a phone call, about a day and time for an appointment. Please come back for a follow-up appointment in 1 year.

## 2020-10-06 NOTE — Progress Notes (Signed)
Subjective:    Patient ID: Meredith Hayes, female    DOB: December 10, 1956, 64 y.o.   MRN: [id number]  HPI Pt returns for f/u of MNG (dx'ed 2019, on a routine exam; bx in 2019 showed (FLUS), cat 3; she chose surveillance over repeat bx; she is euthyroid off rx).  She does not notice the goiter.  Denies neck swelling.   Past Medical History:  Diagnosis Date  . ANXIETY 11/15/2008  . COPD 11/15/2008  . DEPRESSIVE DISORDER 12/12/2008  . ELEVATED BLOOD PRESSURE WITHOUT DIAGNOSIS OF HYPERTENSION 11/14/2008  . GERD 11/15/2008  . 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 SYNCOPE 10/10/2009    Past Surgical History:  Procedure Laterality Date  . CHOLECYSTECTOMY N/A 05/22/2015   Procedure: LAPAROSCOPIC CHOLECYSTECTOMY;  Surgeon: Judeth Horn, MD;  Location: [place];  Service: General;  Laterality: N/A;  . COLONOSCOPY  12/09/11    Social History   Socioeconomic History  . Marital status: Widowed    Spouse name: Not on file  . Number of children: Not on file  . Years of education: Not on file  . Highest education level: Not on file  Occupational History  . Occupation: Radiation protection practitioner: [employer]  Tobacco Use  . Smoking status: Former Smoker    Packs/day: 1.00    Years: 35.00    Pack years: 35.00    Types: Cigarettes    Quit date: 05/01/2007    Years since quitting: 13.4  . Smokeless tobacco: Never Used  . Tobacco comment: quit smoking March 2008  Substance and Sexual Activity  . Alcohol use: Yes    Alcohol/week: 2.0 standard drinks    Types: 2 Cans of beer per week  . Drug use: No  . Sexual activity: Yes  Other Topics Concern  . Not on file  Social History Narrative  . Not on file   Social Determinants of Health   Financial Resource Strain:   . Difficulty of Paying Living Expenses: Not on file  Food Insecurity:   . Worried About Charity fundraiser in the Last Year: Not on file  . Ran Out of Food in the Last Year: Not on file  Transportation Needs:   . Lack of  Transportation (Medical): Not on file  . Lack of Transportation (Non-Medical): Not on file  Physical Activity:   . Days of Exercise per Week: Not on file  . Minutes of Exercise per Session: Not on file  Stress:   . Feeling of Stress : Not on file  Social Connections:   . Frequency of Communication with Friends and Family: Not on file  . Frequency of Social Gatherings with Friends and Family: Not on file  . Attends Religious Services: Not on file  . Active Member of Clubs or Organizations: Not on file  . Attends Archivist Meetings: Not on file  . Marital Status: Not on file  Intimate Partner Violence:   . Fear of Current or Ex-Partner: Not on file  . Emotionally Abused: Not on file  . Physically Abused: Not on file  . Sexually Abused: Not on file    Current Outpatient Medications on File Prior to Visit  Medication Sig Dispense Refill  . acetaminophen (TYLENOL) 325 MG tablet Take 650 mg by mouth every 6 (six) hours as needed.      . ALPRAZolam (XANAX) 0.5 MG tablet TAKE 1 TABLET BY MOUTH EVERY DAY AS NEEDED FOR ANXIETY 30 tablet 5  . calcium carbonate (TUMS -  DOSED IN MG ELEMENTAL CALCIUM) 500 MG chewable tablet Chew by mouth.    . doxycycline (VIBRAMYCIN) 100 MG capsule Take 100 mg by mouth daily.     . DULoxetine (CYMBALTA) 20 MG capsule Take 20 mg by mouth daily.    . fluticasone (FLONASE) 50 MCG/ACT nasal spray Place 1-2 sprays into both nostrils daily as needed for allergies or rhinitis.    Marland Kitchen METRONIDAZOLE, TOPICAL, 0.75 % LOTN Apply 1 application topically 2 (two) times daily.    . Multiple Vitamins-Minerals (MULTIVITAMIN ADULTS 50+) TABS Take 1 tablet by mouth 2 (two) times daily.    . Niacinamide-Zinc-Copper-FA (TL NICOTINAMIDE PO) Take 250 mg by mouth daily.    . OMEGA-3 KRILL OIL PO Take 350 mg by mouth every other day.    Marland Kitchen OVER THE COUNTER MEDICATION Take 50 mg by mouth daily. OTC Pterostilbene    . Probiotic Product (PROBIOTIC PO) Take 1 capsule by mouth daily.  OTC Senior Probiotic 6 Billion CFU    . Sennosides 17.2 MG TABS Take 17.2 mg by mouth daily.     No current facility-administered medications on file prior to visit.    Allergies  Allergen Reactions  . Penicillins Anaphylaxis  . Dexamethasone Sodium Phosphate Rash  . Ciprofloxacin Hcl Other (See Comments)    IV Cipro -- Redness and burning  . Codeine Nausea And Vomiting  . Influenza Vaccine Live     Red, swollen sight  . Other Hives    IV cipro     Family History  Problem Relation Age of Onset  . Arthritis Other   . Hypertension Other   . Kidney cancer Father   . Kidney cancer Sister   . Colon cancer Paternal Grandmother   . Thyroid disease Mother        thyroidect--benign  . Kidney cancer Paternal Grandfather     BP 130/80 (BP Location: Left Arm, Patient Position: Sitting, Cuff Size: Normal)   Pulse 89   Ht 5' 4.5" (1.638 m)   Wt 228 lb 6.4 oz (103.6 kg)   SpO2 95%   BMI 38.60 kg/m    Review of Systems     Objective:   Physical Exam VITAL SIGNS:  See vs page GENERAL: no distress NECK: thyroid is 3-5 times normal size, with multinodular surface.    Lab Results  Component Value Date   TSH 0.72 08/28/2019    [place] (2020): 1. Stable appearance of 1.8 cm TI-RADS category 4 nodule in the inferior aspect of the thyroid isthmus, right of midline. This nodule continues to meet criteria for consideration of fine-needle aspiration biopsy. 2. Additional TI-RADS category 3 nodules again noted which meet criteria for annual surveillance. 3. Diffusely enlarged, heterogeneous and lobular thyroid gland consistent with diffuse multinodular goiter.      Assessment & Plan:  MNG: due for recheck.    Patient Instructions  when you have your routine blood tests next week, please make sure the the TSH is included.   Let's recheck the ultrasound.  you will receive a phone call, about a day and time for an appointment. Please come back for a follow-up appointment in 1 year.

## 2020-10-27 ENCOUNTER — Other Ambulatory Visit: Payer: 59

## 2020-11-10 ENCOUNTER — Ambulatory Visit: Payer: 59

## 2020-11-10 ENCOUNTER — Telehealth: Payer: Self-pay

## 2020-11-10 DIAGNOSIS — E042 Nontoxic multinodular goiter: Secondary | ICD-10-CM

## 2020-11-10 NOTE — Telephone Encounter (Signed)
New message    The patient is asking can the referral enter to [place] be routed to [facility] or [place].

## 2020-11-10 NOTE — Telephone Encounter (Signed)
I have spoken to patient and Meredith Hayes asked if Dr Loanne Drilling would change her order for the US thyroid to go to [facility].

## 2020-11-11 NOTE — Telephone Encounter (Signed)
Patient have been notifed

## 2021-02-13 ENCOUNTER — Encounter: Payer: Self-pay | Admitting: Endocrinology

## 2021-02-24 NOTE — Telephone Encounter (Signed)
Called patient to see exactly where she wanted to go and she said [facility] on [address] faxed them the order and they said they will contact patient to schedule. Informed patient of this.

## 2021-10-06 ENCOUNTER — Ambulatory Visit (INDEPENDENT_AMBULATORY_CARE_PROVIDER_SITE_OTHER): Payer: 59 | Admitting: Endocrinology

## 2021-10-06 ENCOUNTER — Other Ambulatory Visit: Payer: Self-pay

## 2021-10-06 VITALS — BP 168/100 | HR 73 | Ht 64.5 in | Wt 225.6 lb

## 2021-10-06 DIAGNOSIS — E042 Nontoxic multinodular goiter: Secondary | ICD-10-CM

## 2021-10-06 NOTE — Patient Instructions (Addendum)
Your blood pressure is high today.  Please see your primary care provider soon, to have it rechecked.   when you have your routine blood tests soon, please make sure the the TSH is included.   Please recheck the ultrasound in 3/23.  you will receive a phone call, about a day and time for an appointment.   Please come back for a follow-up appointment in 1 year.

## 2021-10-06 NOTE — Progress Notes (Signed)
Subjective:    Patient ID: Meredith Hayes, female    DOB: August 17, 1956, 64 y.o.   MRN: [id number]  HPI Pt returns for f/u of MNG (dx'ed 2019, on a routine exam; bx in 2019 showed (FLUS), cat 3; she chose surveillance over repeat bx; she is euthyroid off rx).  She does not notice the goiter.  Denies neck swelling.   Past Medical History:  Diagnosis Date   ANXIETY 11/15/2008   COPD 11/15/2008   DEPRESSIVE DISORDER 12/12/2008   ELEVATED BLOOD PRESSURE WITHOUT DIAGNOSIS OF HYPERTENSION 11/14/2008   GERD 11/15/2008   NEPHROLITHIASIS, HX OF 11/15/2008   SYNCOPE 10/10/2009    Past Surgical History:  Procedure Laterality Date   CHOLECYSTECTOMY N/A 05/22/2015   Procedure: LAPAROSCOPIC CHOLECYSTECTOMY;  Surgeon: Judeth Horn, MD;  Location: [facility];  Service: General;  Laterality: N/A;   COLONOSCOPY  12/09/11    Social History   Socioeconomic History   Marital status: Widowed    Spouse name: Not on file   Number of children: Not on file   Years of education: Not on file   Highest education level: Not on file  Occupational History   Occupation: RN telemetry    Employer: [employer]  Tobacco Use   Smoking status: Former    Packs/day: 1.00    Years: 35.00    Pack years: 35.00    Types: Cigarettes    Quit date: 05/01/2007    Years since quitting: 14.4   Smokeless tobacco: Never   Tobacco comments:    quit smoking March 2008  Substance and Sexual Activity   Alcohol use: Yes    Alcohol/week: 2.0 standard drinks    Types: 2 Cans of beer per week   Drug use: No   Sexual activity: Yes  Other Topics Concern   Not on file  Social History Narrative   Not on file   Social Determinants of Health   Financial Resource Strain: Not on file  Food Insecurity: Not on file  Transportation Needs: Not on file  Physical Activity: Not on file  Stress: Not on file  Social Connections: Not on file  Intimate Partner Violence: Not on file    Current Outpatient Medications on File Prior to  Visit  Medication Sig Dispense Refill   acetaminophen (TYLENOL) 325 MG tablet Take 650 mg by mouth every 6 (six) hours as needed.       ALPRAZolam (XANAX) 0.5 MG tablet TAKE 1 TABLET BY MOUTH EVERY DAY AS NEEDED FOR ANXIETY 30 tablet 5   calcium carbonate (TUMS - DOSED IN MG ELEMENTAL CALCIUM) 500 MG chewable tablet Chew by mouth.     doxycycline (VIBRAMYCIN) 100 MG capsule Take 100 mg by mouth daily.      DULoxetine (CYMBALTA) 20 MG capsule Take 20 mg by mouth daily.     fluticasone (FLONASE) 50 MCG/ACT nasal spray Place 1-2 sprays into both nostrils daily as needed for allergies or rhinitis.     METRONIDAZOLE, TOPICAL, 0.75 % LOTN Apply 1 application topically 2 (two) times daily.     Multiple Vitamins-Minerals (MULTIVITAMIN ADULTS 50+) TABS Take 1 tablet by mouth 2 (two) times daily.     Niacinamide-Zinc-Copper-FA (TL NICOTINAMIDE PO) Take 250 mg by mouth daily.     OMEGA-3 KRILL OIL PO Take 350 mg by mouth every other day.     OVER THE COUNTER MEDICATION Take 50 mg by mouth daily. OTC Pterostilbene     Probiotic Product (PROBIOTIC PO) Take 1 capsule by mouth  daily. OTC Senior Probiotic 6 Billion CFU     Sennosides 17.2 MG TABS Take 17.2 mg by mouth daily.     No current facility-administered medications on file prior to visit.    Allergies  Allergen Reactions   Penicillins Anaphylaxis   Dexamethasone Sodium Phosphate Rash   Ciprofloxacin Hcl Other (See Comments)    IV Cipro -- Redness and burning   Codeine Nausea And Vomiting   Influenza Vaccine Live     Red, swollen sight   Other Hives    IV cipro     Family History  Problem Relation Age of Onset   Arthritis Other    Hypertension Other    Kidney cancer Father    Kidney cancer Sister    Colon cancer Paternal Grandmother    Thyroid disease Mother        thyroidect--benign   Kidney cancer Paternal Grandfather     BP (!) 168/100 (BP Location: Right Arm, Patient Position: Sitting, Cuff Size: Normal)   Pulse 73   Ht 5'  4.5" (1.638 m)   Wt 225 lb 9.6 oz (102.3 kg)   SpO2 100%   BMI 38.13 kg/m    Review of Systems Denies neck pain.      Objective:   Physical Exam VITAL SIGNS:  See vs page GENERAL: no distress.  NECK: thyroid is 3-5 times normal size, with multinodular surface.       Assessment & Plan:  MNG.  She is due for labs now, and [place] next year.  No medication is needed now  Patient Instructions  Your blood pressure is high today.  Please see your primary care provider soon, to have it rechecked.   when you have your routine blood tests soon, please make sure the the TSH is included.   Please recheck the ultrasound in 3/23.  you will receive a phone call, about a day and time for an appointment.   Please come back for a follow-up appointment in 1 year.

## 2021-12-30 ENCOUNTER — Encounter: Payer: Self-pay | Admitting: Internal Medicine

## 2022-07-14 ENCOUNTER — Encounter: Payer: Self-pay | Admitting: Internal Medicine

## 2022-08-24 ENCOUNTER — Ambulatory Visit (AMBULATORY_SURGERY_CENTER): Payer: 59 | Admitting: *Deleted

## 2022-08-24 VITALS — Ht 64.5 in | Wt 197.0 lb

## 2022-08-24 DIAGNOSIS — Z1211 Encounter for screening for malignant neoplasm of colon: Secondary | ICD-10-CM

## 2022-08-24 MED ORDER — NA SULFATE-K SULFATE-MG SULF 17.5-3.13-1.6 GM/177ML PO SOLN: 1.0000 | ORAL | 0 refills | Status: DC

## 2022-08-24 NOTE — Progress Notes (Signed)
Patient's pre-visit was done today over the phone with the patient. Name,DOB and address verified. Patient denies any allergies to Eggs and Soy. Patient denies any problems with anesthesia/sedation. Patient is not taking any blood thinners. Patient aware to stop diet pill Phentermine 10 days before exam-last dose 09/04/22. No home Oxygen. Insurance confirmed with patient. Pt will upload copy of insurance cards into Mychart-pt will also bring them day of procedure.   Went over prep instructions with patient. Prep instructions sent to pt's MyChart & mailed to pt-pt is aware. Patient understands to call us back with any questions or concerns. Patient is aware of our care-partner policy.

## 2022-09-11 ENCOUNTER — Telehealth: Payer: Self-pay | Admitting: Physician Assistant

## 2022-09-11 DIAGNOSIS — Z1211 Encounter for screening for malignant neoplasm of colon: Secondary | ICD-10-CM

## 2022-09-11 MED ORDER — NA SULFATE-K SULFATE-MG SULF 17.5-3.13-1.6 GM/177ML PO SOLN: 1.0000 | ORAL | 0 refills | Status: DC

## 2022-09-11 NOTE — Telephone Encounter (Signed)
Telephone Call  4:02 09/11/22  Patient called to tell me that the pharmacy never got the order for her prep.  Per chart review it does look like this was sent on 8/29 for her to the pharmacy in [place] [address] per her request.  I resent the prep today.  Asked her to call me back if they still do not have the order by tomorrow and we can try again.  Ellouise Newer, PA-C

## 2022-09-15 ENCOUNTER — Encounter: Payer: Self-pay | Admitting: Internal Medicine

## 2022-09-15 ENCOUNTER — Ambulatory Visit (AMBULATORY_SURGERY_CENTER): Payer: Medicare Other | Admitting: Internal Medicine

## 2022-09-15 VITALS — BP 112/87 | HR 70 | Temp 96.9°F | Resp 13 | Ht 64.0 in | Wt 197.0 lb

## 2022-09-15 DIAGNOSIS — K635 Polyp of colon: Secondary | ICD-10-CM | POA: Diagnosis not present

## 2022-09-15 DIAGNOSIS — D122 Benign neoplasm of ascending colon: Secondary | ICD-10-CM

## 2022-09-15 DIAGNOSIS — D123 Benign neoplasm of transverse colon: Secondary | ICD-10-CM

## 2022-09-15 DIAGNOSIS — Z1211 Encounter for screening for malignant neoplasm of colon: Secondary | ICD-10-CM | POA: Diagnosis not present

## 2022-09-15 MED ORDER — SODIUM CHLORIDE 0.9 % IV SOLN: 500.0000 mL | Freq: Once | INTRAVENOUS | Status: DC

## 2022-09-15 NOTE — Patient Instructions (Signed)
Read all of the handouts given to you by your recovery  room nurse.  YOU HAD AN ENDOSCOPIC PROCEDURE TODAY AT [facility]:   Refer to the procedure report that was given to you for any specific questions about what was found during the examination.  If the procedure report does not answer your questions, please call your gastroenterologist to clarify.  If you requested that your care partner not be given the details of your procedure findings, then the procedure report has been included in a sealed envelope for you to review at your convenience later.  YOU SHOULD EXPECT: Some feelings of bloating in the abdomen. Passage of more gas than usual.  Walking can help get rid of the air that was put into your GI tract during the procedure and reduce the bloating. If you had a lower endoscopy (such as a colonoscopy or flexible sigmoidoscopy) you may notice spotting of blood in your stool or on the toilet paper. If you underwent a bowel prep for your procedure, you may not have a normal bowel movement for a few days.  Please Note:  You might notice some irritation and congestion in your nose or some drainage.  This is from the oxygen used during your procedure.  There is no need for concern and it should clear up in a day or so.  SYMPTOMS TO REPORT IMMEDIATELY:  Following lower endoscopy (colonoscopy or flexible sigmoidoscopy):  Excessive amounts of blood in the stool  Significant tenderness or worsening of abdominal pains  Swelling of the abdomen that is new, acute  Fever of 100F or higher  For urgent or emergent issues, a gastroenterologist can be reached at any hour by calling [phone]. Do not use MyChart messaging for urgent concerns.    DIET:  We do recommend a small meal at first, but then you may proceed to your regular diet.  Drink plenty of fluids but you should avoid alcoholic beverages for 24 hours. Try to eat a high fiber diet, and drink plenty of water.  ACTIVITY:   You should plan to take it easy for the rest of today and you should NOT DRIVE or use heavy machinery until tomorrow (because of the sedation medicines used during the test).    FOLLOW UP: Our staff will call the number listed on your records the next business day following your procedure.  We will call around 7:15- 8:00 am to check on you and address any questions or concerns that you may have regarding the information given to you following your procedure. If we do not reach you, we will leave a message.     If any biopsies were taken you will be contacted by phone or by letter within the next 1-3 weeks.  Please call us at [phone] if you have not heard about the biopsies in 3 weeks.    SIGNATURES/CONFIDENTIALITY: You and/or your care partner have signed paperwork which will be entered into your electronic medical record.  These signatures attest to the fact that that the information above on your After Visit Summary has been reviewed and is understood.  Full responsibility of the confidentiality of this discharge information lies with you and/or your care-partner.

## 2022-09-15 NOTE — Progress Notes (Signed)
Report given to PACU, vss 

## 2022-09-15 NOTE — Op Note (Signed)
[name] Procedure Date: 09/15/2022 11:05 AM MRN: [id number] Endoscopist: Sonny Masters "[name] ,  Age: 66 Referring MD:  Date of Birth: 16-Feb-1956 Gender: Female Account #: [id number] Procedure:                Colonoscopy Indications:              Screening for colorectal malignant neoplasm Medicines:                Monitored Anesthesia Care Procedure:                Pre-Anesthesia Assessment:                           - Prior to the procedure, a History and Physical                            was performed, and patient medications and                            allergies were reviewed. The patient's tolerance of                            previous anesthesia was also reviewed. The risks                            and benefits of the procedure and the sedation                            options and risks were discussed with the patient.                            All questions were answered, and informed consent                            was obtained. Prior Anticoagulants: The patient has                            taken no previous anticoagulant or antiplatelet                            agents. ASA Grade Assessment: II - A patient with                            mild systemic disease. After reviewing the risks                            and benefits, the patient was deemed in                            satisfactory condition to undergo the procedure.                           After obtaining informed consent, the colonoscope  was passed under direct vision. Throughout the                            procedure, the patient's blood pressure, pulse, and                            oxygen saturations were monitored continuously. The                            CF [id number] #[id number] was introduced through the anus                            and advanced to the the terminal ileum. The                            colonoscopy was  performed without difficulty. The                            patient tolerated the procedure well. The quality                            of the bowel preparation was good. The terminal                            ileum, ileocecal valve, appendiceal orifice, and                            rectum were photographed. Scope In: 11:07:47 AM Scope Out: 11:33:40 AM Scope Withdrawal Time: 0 hours 21 minutes 26 seconds  Total Procedure Duration: 0 hours 25 minutes 53 seconds  Findings:                 The terminal ileum appeared normal.                           A 3 mm polyp was found in the ascending colon. The                            polyp was sessile. The polyp was removed with a                            cold snare. Resection and retrieval were complete.                           A 6 mm polyp was found in the transverse colon. The                            polyp was sessile. The polyp was removed with a                            cold snare. Resection and retrieval were complete.  Multiple small and large-mouthed diverticula were                            found in the sigmoid colon and descending colon.                           Non-bleeding internal hemorrhoids were found during                            retroflexion. Complications:            No immediate complications. Estimated Blood Loss:     Estimated blood loss was minimal. Impression:               - The examined portion of the ileum was normal.                           - One 3 mm polyp in the ascending colon, removed                            with a cold snare. Resected and retrieved.                           - One 6 mm polyp in the transverse colon, removed                            with a cold snare. Resected and retrieved.                           - Diverticulosis in the sigmoid colon and in the                            descending colon.                           - Non-bleeding internal  hemorrhoids. Recommendation:           - Discharge patient to home (with escort).                           - Await pathology results.                           - The findings and recommendations were discussed                            with the patient. Dr Georgian Co "[place]" Hugo,  09/15/2022 11:39:42 AM

## 2022-09-15 NOTE — Progress Notes (Signed)
Called to room to assist during endoscopic procedure.  Patient ID and intended procedure confirmed with present staff. Received instructions for my participation in the procedure from the performing physician.  

## 2022-09-15 NOTE — Progress Notes (Signed)
GASTROENTEROLOGY PROCEDURE H&P NOTE   Primary Care Physician: Vicenta Aly, [place]    Reason for Procedure:   Colon cancer screening  Plan:    Colonoscopy  Patient is appropriate for endoscopic procedure(s) in the ambulatory ([place] [place]) setting.  The nature of the procedure, as well as the risks, benefits, and alternatives were carefully and thoroughly reviewed with the patient. Ample time for discussion and questions allowed. The patient understood, was satisfied, and agreed to proceed.     HPI: Meredith Hayes is a 66 y.o. female who presents for colonoscopy for colon cancer screening. Denies blood in stools, changes in bowel habits, weight loss. Denies family history of colon cancer.  Past Medical History:  Diagnosis Date   ANXIETY 11/15/2008   COPD 11/15/2008   DEPRESSIVE DISORDER 12/12/2008   ELEVATED BLOOD PRESSURE WITHOUT DIAGNOSIS OF HYPERTENSION 11/14/2008   GERD 11/15/2008   NEPHROLITHIASIS, HX OF 11/15/2008   SYNCOPE 10/10/2009   Thyroid nodule     Past Surgical History:  Procedure Laterality Date   CHOLECYSTECTOMY N/A 05/22/2015   Procedure: LAPAROSCOPIC CHOLECYSTECTOMY;  Surgeon: Judeth Horn, MD;  Location: [facility];  Service: General;  Laterality: N/A;   COLONOSCOPY  12/09/2011   LAPAROSCOPIC GASTRIC SLEEVE RESECTION  2012    Prior to Admission medications   Medication Sig Start Date End Date Taking? Authorizing Provider  ALPRAZolam Duanne Moron) 0.5 MG tablet TAKE 1 TABLET BY MOUTH EVERY DAY AS NEEDED FOR ANXIETY 05/06/13  Yes Janith Lima, MD  calcium carbonate (TUMS - DOSED IN MG ELEMENTAL CALCIUM) 500 MG chewable tablet Chew by mouth.   Yes [provider]  DULoxetine (CYMBALTA) 20 MG capsule Take 20 mg by mouth daily.   Yes [provider]  Multiple Vitamins-Minerals (MULTIVITAMIN ADULTS 50+) TABS Take 1 tablet by mouth 2 (two) times daily.   Yes [provider]  OMEGA-3 KRILL OIL PO Take 350 mg by mouth every other day.   Yes  [provider]  Probiotic Product (PROBIOTIC PO) Take 1 capsule by mouth daily. OTC Senior Probiotic 6 Billion CFU   Yes [provider]  acetaminophen (TYLENOL) 325 MG tablet Take 650 mg by mouth every 6 (six) hours as needed.      [provider]  fluticasone (FLONASE) 50 MCG/ACT nasal spray Place 1-2 sprays into both nostrils daily as needed for allergies or rhinitis.    [provider]  LOMAIRA 8 MG TABS Take by mouth. 06/25/22   [provider]  naproxen sodium (ALEVE) 220 MG tablet Take 220 mg by mouth.    [provider]  Sennosides 17.2 MG TABS Take 17.2 mg by mouth daily.    [provider]  topiramate (TOPAMAX) 25 MG tablet TAKE 1 TABLET BY MOUTH ONCE DAILY AROUND NOON 06/12/20   [provider]  triamcinolone cream (KENALOG) 0.5 % Apply topically 2 (two) times daily. 04/23/22   [provider]    Current Outpatient Medications  Medication Sig Dispense Refill   ALPRAZolam (XANAX) 0.5 MG tablet TAKE 1 TABLET BY MOUTH EVERY DAY AS NEEDED FOR ANXIETY 30 tablet 5   calcium carbonate (TUMS - DOSED IN MG ELEMENTAL CALCIUM) 500 MG chewable tablet Chew by mouth.     DULoxetine (CYMBALTA) 20 MG capsule Take 20 mg by mouth daily.     Multiple Vitamins-Minerals (MULTIVITAMIN ADULTS 50+) TABS Take 1 tablet by mouth 2 (two) times daily.     OMEGA-3 KRILL OIL PO Take 350 mg by mouth every other  day.     Probiotic Product (PROBIOTIC PO) Take 1 capsule by mouth daily. OTC Senior Probiotic 6 Billion CFU     acetaminophen (TYLENOL) 325 MG tablet Take 650 mg by mouth every 6 (six) hours as needed.       fluticasone (FLONASE) 50 MCG/ACT nasal spray Place 1-2 sprays into both nostrils daily as needed for allergies or rhinitis.     LOMAIRA 8 MG TABS Take by mouth.     naproxen sodium (ALEVE) 220 MG tablet Take 220 mg by mouth.     Sennosides 17.2 MG TABS Take 17.2 mg by mouth daily.     topiramate (TOPAMAX) 25 MG tablet TAKE 1  TABLET BY MOUTH ONCE DAILY AROUND NOON     triamcinolone cream (KENALOG) 0.5 % Apply topically 2 (two) times daily.     Current Facility-Administered Medications  Medication Dose Route Frequency Provider Last Rate Last Admin   0.9 %  sodium chloride infusion  500 mL Intravenous Once Sharyn Creamer, MD        Allergies as of 09/15/2022 - Review Complete 09/15/2022  Allergen Reaction Noted   Penicillins Anaphylaxis    Dexamethasone sodium phosphate Rash 10/16/2015   Ciprofloxacin hcl Other (See Comments) 05/21/2015   Codeine Nausea And Vomiting 12/02/2011   Influenza vaccine live  10/18/2011   Other Hives 08/06/2015    Family History  Problem Relation Age of Onset   Thyroid disease Mother        thyroidect--benign   Kidney cancer Father    Kidney cancer Sister    Colon cancer Paternal Grandmother    Kidney cancer Paternal Grandfather    Arthritis Other    Hypertension Other    Esophageal cancer Neg Hx    Stomach cancer Neg Hx     Social History   Socioeconomic History   Marital status: Widowed    Spouse name: Not on file   Number of children: Not on file   Years of education: Not on file   Highest education level: Not on file  Occupational History   Occupation: RN telemetry    Employer: [employer]  Tobacco Use   Smoking status: Former    Packs/day: 1.00    Years: 35.00    Total pack years: 35.00    Types: Cigarettes    Quit date: 05/01/2007    Years since quitting: 15.3   Smokeless tobacco: Never   Tobacco comments:    quit smoking March 2008  Vaping Use   Vaping Use: Never used  Substance and Sexual Activity   Alcohol use: Yes    Comment: wine 2 glasses per month per pt   Drug use: Yes    Types: Marijuana    Comment: every other month per pt   Sexual activity: Yes  Other Topics Concern   Not on file  Social History Narrative   Not on file   Social Determinants of Health   Financial Resource Strain: Not on file  Food Insecurity: Not on file   Transportation Needs: Not on file  Physical Activity: Not on file  Stress: Not on file  Social Connections: Not on file  Intimate Partner Violence: Not on file    Physical Exam: Vital signs in last 24 hours: BP 134/73   Pulse 69   Temp (!) 96.9 F (36.1 C)   Ht '5\' 4"'$  (1.626 m)   Wt 197 lb (89.4 kg)   SpO2 96%   BMI 33.81 kg/m  GEN: NAD EYE:  Sclerae anicteric ENT: MMM CV: Non-tachycardic Pulm: No increased work of breathing GI: Soft, NT/ND NEURO:  Alert & Oriented   [name], MD [facility]  09/15/2022 10:56 AM

## 2022-09-15 NOTE — Progress Notes (Signed)
Pt's states no medical or surgical changes since previsit or office visit. 

## 2022-09-16 ENCOUNTER — Telehealth: Payer: Self-pay | Admitting: *Deleted

## 2022-09-16 NOTE — Telephone Encounter (Signed)
  Follow up Call-     09/15/2022   10:45 AM  Call back number  Post procedure Call Back phone  # [phone]  Permission to leave phone message Yes     Patient questions:  Do you have a fever, pain , or abdominal swelling? No. Pain Score  0 *  Have you tolerated food without any problems? Yes.    Have you been able to return to your normal activities? Yes.    Do you have any questions about your discharge instructions: Diet   No. Medications  No. Follow up visit  No.  Do you have questions or concerns about your Care? No.  Actions: * If pain score is 4 or above: No action needed, pain <4.

## 2022-09-17 ENCOUNTER — Encounter: Payer: Self-pay | Admitting: Internal Medicine

## 2022-10-06 ENCOUNTER — Ambulatory Visit: Payer: 59 | Admitting: Endocrinology
